# Patient Record
Sex: Female | Born: 1985 | Race: Black or African American | Hispanic: No | Marital: Married | Smoking: Never smoker
Health system: Southern US, Community
[De-identification: ages and names within clinical notes are randomized; demographics above are authoritative.]

## PROBLEM LIST (undated history)

## (undated) DIAGNOSIS — L409 Psoriasis, unspecified: Secondary | ICD-10-CM

## (undated) DIAGNOSIS — F419 Anxiety disorder, unspecified: Secondary | ICD-10-CM

## (undated) DIAGNOSIS — E669 Obesity, unspecified: Secondary | ICD-10-CM

## (undated) DIAGNOSIS — M1990 Unspecified osteoarthritis, unspecified site: Secondary | ICD-10-CM

## (undated) DIAGNOSIS — E282 Polycystic ovarian syndrome: Secondary | ICD-10-CM

## (undated) HISTORY — DX: Obesity, unspecified: E66.9

## (undated) HISTORY — DX: Psoriasis, unspecified: L40.9

## (undated) HISTORY — PX: BREAST REDUCTION SURGERY: SHX8

---
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
## 2005-02-10 ENCOUNTER — Emergency Department (HOSPITAL_COMMUNITY): Admission: EM | Admit: 2005-02-10 | Discharge: 2005-02-10 | Payer: Self-pay | Admitting: Emergency Medicine

## 2005-12-14 ENCOUNTER — Emergency Department (HOSPITAL_COMMUNITY): Admission: EM | Admit: 2005-12-14 | Discharge: 2005-12-15 | Payer: Self-pay | Admitting: Emergency Medicine

## 2006-12-15 ENCOUNTER — Emergency Department (HOSPITAL_COMMUNITY): Admission: EM | Admit: 2006-12-15 | Discharge: 2006-12-15 | Payer: Self-pay | Admitting: Emergency Medicine

## 2008-01-09 ENCOUNTER — Emergency Department (HOSPITAL_COMMUNITY): Admission: EM | Admit: 2008-01-09 | Discharge: 2008-01-09 | Payer: Self-pay | Admitting: Emergency Medicine

## 2008-05-05 ENCOUNTER — Emergency Department (HOSPITAL_COMMUNITY): Admission: EM | Admit: 2008-05-05 | Discharge: 2008-05-05 | Payer: Self-pay | Admitting: Emergency Medicine

## 2008-05-14 ENCOUNTER — Emergency Department (HOSPITAL_COMMUNITY): Admission: EM | Admit: 2008-05-14 | Discharge: 2008-05-14 | Payer: Self-pay | Admitting: Emergency Medicine

## 2008-05-17 ENCOUNTER — Emergency Department (HOSPITAL_COMMUNITY): Admission: EM | Admit: 2008-05-17 | Discharge: 2008-05-17 | Payer: Self-pay | Admitting: Emergency Medicine

## 2010-09-13 ENCOUNTER — Ambulatory Visit (HOSPITAL_COMMUNITY): Payer: Self-pay

## 2011-01-07 ENCOUNTER — Emergency Department (HOSPITAL_COMMUNITY)
Admission: EM | Admit: 2011-01-07 | Discharge: 2011-01-07 | Disposition: A | Discharge status: Home / Self Care | Payer: Self-pay | Attending: Emergency Medicine | Admitting: Emergency Medicine

## 2011-01-07 ENCOUNTER — Emergency Department (HOSPITAL_COMMUNITY): Payer: Self-pay

## 2011-01-07 DIAGNOSIS — R0602 Shortness of breath: Secondary | ICD-10-CM | POA: Insufficient documentation

## 2011-01-07 DIAGNOSIS — R059 Cough, unspecified: Secondary | ICD-10-CM | POA: Insufficient documentation

## 2011-01-07 DIAGNOSIS — R509 Fever, unspecified: Secondary | ICD-10-CM | POA: Insufficient documentation

## 2011-01-07 DIAGNOSIS — J45909 Unspecified asthma, uncomplicated: Secondary | ICD-10-CM | POA: Insufficient documentation

## 2011-01-07 DIAGNOSIS — R05 Cough: Secondary | ICD-10-CM | POA: Insufficient documentation

## 2011-01-07 LAB — CBC
HCT: 39.7 % (ref 36.0–46.0)
MCH: 30.1 pg (ref 26.0–34.0)
MCHC: 33.5 g/dL (ref 30.0–36.0)
Platelets: 249 10*3/uL (ref 150–400)
RBC: 4.42 MIL/uL (ref 3.87–5.11)
RDW: 12.4 % (ref 11.5–15.5)
WBC: 11.1 10*3/uL — ABNORMAL HIGH (ref 4.0–10.5)

## 2011-01-07 LAB — POCT I-STAT, CHEM 8
Calcium, Ion: 1.12 mmol/L (ref 1.12–1.32)
Creatinine, Ser: 0.8 mg/dL (ref 0.50–1.10)
Glucose, Bld: 109 mg/dL — ABNORMAL HIGH (ref 70–99)
HCT: 43 % (ref 36.0–46.0)
Hemoglobin: 14.6 g/dL (ref 12.0–15.0)
Potassium: 3.6 mEq/L (ref 3.5–5.1)
TCO2: 21 mmol/L (ref 0–100)

## 2011-01-07 LAB — URINALYSIS, ROUTINE W REFLEX MICROSCOPIC
Glucose, UA: NEGATIVE mg/dL
Hgb urine dipstick: NEGATIVE
Ketones, ur: NEGATIVE mg/dL
Leukocytes, UA: NEGATIVE
Nitrite: NEGATIVE
Protein, ur: NEGATIVE mg/dL
Specific Gravity, Urine: 1.02 (ref 1.005–1.030)
Urobilinogen, UA: 1 mg/dL (ref 0.0–1.0)

## 2011-01-07 LAB — DIFFERENTIAL
Basophils Absolute: 0 10*3/uL (ref 0.0–0.1)
Basophils Relative: 0 % (ref 0–1)
Eosinophils Absolute: 0.6 10*3/uL (ref 0.0–0.7)
Eosinophils Relative: 5 % (ref 0–5)
Lymphocytes Relative: 17 % (ref 12–46)
Monocytes Absolute: 0.7 10*3/uL (ref 0.1–1.0)
Neutrophils Relative %: 71 % (ref 43–77)

## 2011-01-07 LAB — POCT PREGNANCY, URINE: Preg Test, Ur: NEGATIVE

## 2011-02-11 LAB — RAPID STREP SCREEN (MED CTR MEBANE ONLY): Streptococcus, Group A Screen (Direct): POSITIVE — AB

## 2011-02-21 LAB — I-STAT 8, (EC8 V) (CONVERTED LAB)
BUN: 4 — ABNORMAL LOW
Bicarbonate: 25.6 — ABNORMAL HIGH
Chloride: 107
Glucose, Bld: 88
HCT: 43
Hemoglobin: 14.6
Operator id: 234501
Potassium: 4
Sodium: 140
TCO2: 27
pCO2, Ven: 43.3 — ABNORMAL LOW
pH, Ven: 7.38 — ABNORMAL HIGH

## 2011-02-21 LAB — POCT CARDIAC MARKERS
CKMB, poc: <1 — ABNORMAL LOW
Myoglobin, poc: 28
Operator id: 294501
Troponin i, poc: <0.05

## 2011-02-21 LAB — PROTIME-INR
INR: 1
Prothrombin Time: 13.2

## 2011-02-21 LAB — D-DIMER, QUANTITATIVE: D-Dimer, Quant: 0.34

## 2011-02-21 LAB — POCT I-STAT CREATININE
Creatinine, Ser: 0.7
Operator id: 234501

## 2011-09-06 ENCOUNTER — Encounter: Payer: Self-pay | Admitting: *Deleted

## 2011-09-06 ENCOUNTER — Other Ambulatory Visit: Payer: Self-pay | Admitting: Obstetrics and Gynecology

## 2011-09-06 ENCOUNTER — Ambulatory Visit (INDEPENDENT_AMBULATORY_CARE_PROVIDER_SITE_OTHER): Payer: Self-pay | Admitting: *Deleted

## 2011-09-06 VITALS — BP 127/87 | HR 94 | Temp 98.5°F | Ht 66.0 in | Wt 219.8 lb

## 2011-09-06 DIAGNOSIS — N63 Unspecified lump in unspecified breast: Secondary | ICD-10-CM

## 2011-09-06 DIAGNOSIS — Z1239 Encounter for other screening for malignant neoplasm of breast: Secondary | ICD-10-CM

## 2011-09-06 DIAGNOSIS — N6320 Unspecified lump in the left breast, unspecified quadrant: Secondary | ICD-10-CM

## 2011-09-06 NOTE — Patient Instructions (Signed)
Taught patient how to perform BSE and gave educational materials to take home. Patient did not need a Pap smear today due to last Pap smear was August 2012 per patient. Let her know BCCCP will cover Pap smears every 3 years unless has a history of abnormal Pap smears. Patient referred to the [facility] for left breast ultrasound. Appointment scheduled for Sep 15, 2011 at 1400. Patient is scheduled to follow up at the [facility] for amenorrhea. Appointment scheduled for Wednesday, Sep 07, 2011 at 1500. Patient aware of appointments and will be there. Let patient know will follow up with her within the next couple weeks with results. Patient verbalized understanding.

## 2011-09-06 NOTE — Progress Notes (Signed)
Complaints of lump in left breast that is tender to touch. Complaints of itching and moist irritated skin between breasts.  Pap Smear:    Pap smear not performed today. Per patient last Pap smear was August 2012 at [facility] and normal. Per patient she has no history of abnormal Pap smears. No Pap smear results in EPIC. Have requested Pap smear result from [facility].  Physical exam: Breasts Breasts symmetrical. Skin between breasts yeast like appearance and per patient itches. Suggested to try a small amount of OTC lotrimin cream between breast daily and if doesn't improve to notify her physician. No nipple retraction bilateral breasts. No nipple discharge bilateral breasts. No lymphadenopathy. No lumps palpated right breast. Palpated lump in the left breast at 8 o'clock 6 cm from the areola. Left breast lump tender to palpation. Patient referred to the [facility] for left breast ultrasound. Appointment scheduled for Sep 15, 2011 at 1400.         Pelvic/Bimanual No Pap smear completed today since last Pap smear was August 2012. Pap smear not indicated per BCCCP guidelines. Patient stated she has not had a menstrual cycle in 10 months. Patient is not on any type of birth control and stated had a negative UPT 2 weeks ago. Referred patient to the [facility] for follow up. Appointment scheduled for Wednesday, Sep 07, 2011 at 1500.

## 2011-09-07 ENCOUNTER — Encounter: Payer: Self-pay | Admitting: Physician Assistant

## 2011-09-07 ENCOUNTER — Ambulatory Visit (INDEPENDENT_AMBULATORY_CARE_PROVIDER_SITE_OTHER): Payer: Self-pay | Admitting: Physician Assistant

## 2011-09-07 VITALS — BP 131/97 | HR 83 | Temp 99.2°F | Ht 66.0 in | Wt 220.3 lb

## 2011-09-07 DIAGNOSIS — N911 Secondary amenorrhea: Secondary | ICD-10-CM

## 2011-09-07 DIAGNOSIS — L680 Hirsutism: Secondary | ICD-10-CM

## 2011-09-07 DIAGNOSIS — E669 Obesity, unspecified: Secondary | ICD-10-CM

## 2011-09-07 DIAGNOSIS — N912 Amenorrhea, unspecified: Secondary | ICD-10-CM

## 2011-09-07 DIAGNOSIS — L83 Acanthosis nigricans: Secondary | ICD-10-CM

## 2011-09-07 LAB — POCT PREGNANCY, URINE: Preg Test, Ur: NEGATIVE

## 2011-09-07 LAB — FOLLICLE STIMULATING HORMONE: FSH: 4.8 m[IU]/mL

## 2011-09-07 LAB — PROLACTIN: Prolactin: 7 ng/mL

## 2011-09-07 MED ORDER — MEDROXYPROGESTERONE ACETATE 10 MG PO TABS: 10.0000 mg | ORAL_TABLET | Freq: Every day | ORAL | Status: DC

## 2011-09-07 NOTE — Patient Instructions (Signed)
1800 Calorie Diabetic Diet The 1800 calorie diabetic diet is designed for eating up to 1800 calories each day. Following this diet and making healthy meal choices can help improve overall health. It controls blood glucose (sugar) levels, and it can also help lower blood pressure and cholesterol. SERVING SIZES Measuring foods and serving sizes helps to make sure you are getting the right amount of food. The list below tells how big or small some common serving sizes are:  1 oz.........4 stacked dice.   3 oz.........Deck of cards.   1 tsp........Tip of little finger.   1 tbs........Thumb.   2 tbs........Golf ball.    cup.......Half of a fist.   1 cup........A fist.  GUIDELINES FOR CHOOSING FOODS The goal of this diet is to eat a variety of foods and limit calories to 1800 each day. This can be done by choosing foods that are low in calories and fat. The diet also suggests eating small amounts of food frequently. Doing this helps control your blood glucose levels so they do not get too high or too low. Each meal or snack may include a protein food source to help you feel more satisfied. Try to eat about the same amount of food around the same time each day. This includes weekend days, travel days, and days off work. Space your meals about 4 to 5 hours apart, and add a snack between them, if you wish.  For example, a daily food plan could include breakfast, a morning snack, lunch, dinner, and an evening snack. Healthy meals and snacks have different types of foods, including whole grains, vegetables, fruits, lean meats, poultry, fish, and dairy products. As you plan your meals, select a variety of foods. Choose from the bread and starch, vegetable, fruit, dairy, and meat/protein groups. Examples of foods from each group are listed below with their suggested serving sizes. Use measuring cups and spoons to become familiar with what a healthy portion looks like. Bread and Starch Each serving equals  15 grams of carbohydrates.  1 slice bread.    bagel.    cup cold cereal (unsweetened).    cup hot cereal or mashed potatoes.   1 small potato (size of a computer mouse).   ? cup cooked pasta or rice.    English muffin.   1 cup broth-based soup.   3 cups of popcorn.   4 to 6 whole-wheat crackers.    cup cooked beans, peas, or corn.  Vegetables Each serving equals 5 grams of carbohydrates.   cup cooked vegetables.   1 cup raw vegetables.    cup tomato or vegetable juice.  Fruit Each serving equals 15 grams of carbohydrates.  1 small apple or orange.   1  cup watermelon or strawberries.    cup applesauce (no sugar added).   2 tbs raisins.    banana.    cup canned fruit, packed in water or in its own juice.    cup unsweetened fruit juice.  Dairy Each serving equals 12 to 15 grams of carbohydrates.  1 cup fat-free milk.   6 oz artificially sweetened yogurt or plain yogurt.   1 cup low-fat buttermilk.   1 cup soy milk.   1 cup almond milk.  Meat/Protein  1 large egg.   2 to 3 oz meat, poultry, or fish.    cup low-fat cottage cheese.   1 tbs peanut butter.   1 oz low-fat cheese.    cup tuna, packed in water.      cup tofu.  Fat  1 tsp oil.   1 tsp trans-fat-free margarine.   1 tsp butter.   1 tsp mayonnaise.   2 tbs avocado.   1 tbs salad dressing.   1 tbs cream cheese.   2 tbs sour cream.  SAMPLE 1800 CALORIE DIET PLAN Breakfast   cup unsweetened cereal (1 carb serving).   1 cup fat-free milk (1 carb serving).   1 slice whole-wheat toast (1 carb serving).    small banana (1 carb serving).   1 scrambled egg.   1 tsp trans-fat-free margarine.  Lunch  Tuna sandwich.   2 slices whole-wheat bread (2 carb servings).    cup canned tuna in water, drained.   1 tbs reduced fat mayonnaise.   1 stalk celery, chopped.   2 slices tomato.   1 lettuce leaf.   1 cup carrot sticks.   24 to 30 seedless  grapes (2 carb servings).   6 oz light yogurt (1 carb serving).  Afternoon Snack  3 graham cracker squares (1 carb serving).   1 cup fat-free milk (1 carb serving).   1 tbs peanut butter.  Dinner  3 oz salmon, broiled with 1 tsp oil.   1 cup mashed potatoes (2 carb servings) with 1 tsp trans-fat-free margarine.   1 cup fresh or frozen green beans.   1 cup steamed asparagus.   1 cup fat-free milk (1 carb serving).  Evening Snack  3 cups of air-popped popcorn (1 carb serving).   2 tbs Parmesan cheese.  Meal Plan You can use this worksheet to help you make a daily meal plan based on the 1800 calorie diabetic diet suggestions. If you are using this plan to help you control your blood glucose, you may interchange carbohydrate-containing foods (dairy, starches, and fruits). Select a variety of fresh foods of varying colors and flavors. The total amount of carbohydrate in your meals or snacks is more important than making sure you include all of the food groups every time you eat. Choose from the approximate amount of the following foods to build your day's meals:  8 Starches.   4 Vegetables.   3 Fruits.   2 Dairy.   6 to 7 oz Meat/Protein.   Up to 4 Fats.  Your dietician can use this worksheet to help you decide how many servings and which types of foods are right for you. BREAKFAST Food Group and Servings / Food Choice Starches _______________________________________________________ Dairy __________________________________________________________ Fruit ___________________________________________________________ Meat/Protein ____________________________________________________ Fat ____________________________________________________________ LUNCH Food Group and Servings / Food Choice Starch _________________________________________________________ Meat/Protein ___________________________________________________ Vegetables  _____________________________________________________ Fruit __________________________________________________________ Dairy __________________________________________________________ Fat ____________________________________________________________ Aura Fey Food Group and Servings / Food Choice Starch ________________________________________________________ Meat/Protein ___________________________________________________ Fruit __________________________________________________________ Dairy __________________________________________________________ Laural Golden Food Group and Servings / Food Choice Starches _______________________________________________________ Meat/Protein ___________________________________________________ Dairy __________________________________________________________ Vegetable ______________________________________________________ Fruit ___________________________________________________________ Fat ____________________________________________________________ Lollie Sails Food Group and Servings / Food Choice Fruit __________________________________________________________ Meat/Protein ___________________________________________________ Dairy __________________________________________________________ Starch _________________________________________________________ DAILY TOTALS Starches _________________________ Vegetables _______________________ Fruits ____________________________ Dairy ____________________________ Meat/Protein_____________________ Fats _____________________________ Document Released: 11/15/2004 Document Revised: 04/14/2011 Document Reviewed: 03/11/2011 [employer] Patient Information 2012 [place], [place].Polycystic Ovarian Syndrome Polycystic ovarian syndrome is a condition with a number of problems. One problem is with the ovaries. The ovaries are organs located in the female pelvis, on each side of the uterus. Usually, during the menstrual  cycle, an egg is released from 1 ovary every month. This is called ovulation. When the egg is fertilized, it goes into the womb (uterus), which allows  for the growth of a baby. The egg travels from the ovary through the fallopian tube to the uterus. The ovaries also make the hormones estrogen and progesterone. These hormones help the development of a woman's breasts, body shape, and body hair. They also regulate the menstrual cycle and pregnancy. Sometimes, cysts form in the ovaries. A cyst is a fluid-filled sac. On the ovary, different types of cysts can form. The most common type of ovarian cyst is called a functional or ovulation cyst. It is normal, and often forms during the normal menstrual cycle. Each month, a woman's ovaries grow tiny cysts that hold the eggs. When an egg is fully grown, the sac breaks open. This releases the egg. Then, the sac which released the egg from the ovary dissolves. In one type of functional cyst, called a follicle cyst, the sac does not break open to release the egg. It may actually continue to grow. This type of cyst usually disappears within 1 to 3 months.  One type of cyst problem with the ovaries is called Polycystic Ovarian Syndrome (PCOS). In this condition, many follicle cysts form, but do not rupture and produce an egg. This health problem can affect the following:  Menstrual cycle.   Heart.   Obesity.   Cancer of the uterus.   Fertility.   Blood vessels.   Hair growth (face and body) or baldness.   Hormones.   Appearance.   High blood pressure.   Stroke.   Insulin production.   Inflammation of the liver.   Elevated blood cholesterol and triglycerides.  CAUSES   No one knows the exact cause of PCOS.   Women with PCOS often have a mother or sister with PCOS. There is not yet enough proof to say this is inherited.   Many women with PCOS have a weight problem.   Researchers are looking at the relationship between PCOS and the body's  ability to make insulin. Insulin is a hormone that regulates the change of sugar, starches, and other food into energy for the body's use, or for storage. Some women with PCOS make too much insulin. It is possible that the ovaries react by making too many female hormones, called androgens. This can lead to acne, excessive hair growth, weight gain, and ovulation problems.   Too much production of luteinizing hormone (LH) from the pituitary gland in the brain stimulates the ovary to produce too much female hormone (androgen).  SYMPTOMS   Infrequent or no menstrual periods, and/or irregular bleeding.   Inability to get pregnant (infertility), because of not ovulating.   Increased growth of hair on the face, chest, stomach, back, thumbs, thighs, or toes.   Acne, oily skin, or dandruff.   Pelvic pain.   Weight gain or obesity, usually carrying extra weight around the waist.   Type 2 diabetes (this is the diabetes that usually does not need insulin).   High cholesterol.   High blood pressure.   Female-pattern baldness or thinning hair.   Patches of thickened and dark brown or black skin on the neck, arms, breasts, or thighs.   Skin tags, or tiny excess flaps of skin, in the armpits or neck area.   Sleep apnea (excessive snoring and breathing stops at times while asleep).   Deepening of the voice.   Gestational diabetes when pregnant.   Increased risk of miscarriage with pregnancy.  DIAGNOSIS  There is no single test to diagnose PCOS.   Your caregiver will:   Take a medical  history.   Perform a pelvic exam.   Perform an ultrasound.   Check your female and female hormone levels.   Measure glucose or sugar levels in the blood.   Do other blood tests.   If you are producing too many female hormones, your caregiver will make sure it is from PCOS. At the physical exam, your caregiver will want to evaluate the areas of increased hair growth. Try to allow natural hair growth for a few  days before the visit.   During a pelvic exam, the ovaries may be enlarged or swollen by the increased number of small cysts. This can be seen more easily by vaginal ultrasound or screening, to examine the ovaries and lining of the uterus (endometrium) for cysts. The uterine lining may become thicker, if there has not been a regular period.  TREATMENT  Because there is no cure for PCOS, it needs to be managed to prevent problems. Treatments are based on your symptoms. Treatment is also based on whether you want to have a baby or whether you need contraception.  Treatment may include:  Progesterone hormone, to start a menstrual period.   Birth control pills, to make you have regular menstrual periods.   Medicines to make you ovulate, if you want to get pregnant.   Medicines to control your insulin.   Medicine to control your blood pressure.   Medicine and diet, to control your high cholesterol and triglycerides in your blood.   Surgery, making small holes in the ovary, to decrease the amount of female hormone production. This is done through a long, lighted tube (laparoscope), placed into the pelvis through a tiny incision in the lower abdomen.  Your caregiver will go over some of the choices with you. WOMEN WITH PCOS HAVE THESE CHARACTERISTICS:  High levels of female hormones called androgens.   An irregular or no menstrual cycle.   May have many small cysts in their ovaries.  PCOS is the most common hormonal reproductive problem in women of childbearing age. WHY DO WOMEN WITH PCOS HAVE TROUBLE WITH THEIR MENSTRUAL CYCLE? Each month, about 20 eggs start to mature in the ovaries. As one egg grows and matures, the follicle breaks open to release the egg, so it can travel through the fallopian tube for fertilization. When the single egg leaves the follicle, ovulation takes place. In women with PCOS, the ovary does not make all of the hormones it needs for any of the eggs to fully mature. They  may start to grow and accumulate fluid, but no one egg becomes large enough. Instead, some may remain as cysts. Since no egg matures or is released, ovulation does not occur and the hormone progesterone is not made. Without progesterone, a woman's menstrual cycle is irregular or absent. Also, the cysts produce female hormones, which continue to prevent ovulation.  Document Released: 08/19/2004 Document Revised: 04/14/2011 Document Reviewed: 03/13/2009 [facility] Patient Information 2012 [place], [place].Secondary Amenorrhea  Secondary amenorrhea is the stopping of menstrual flow for 3 to 6 months in a female who has previously had periods. There are many possible causes. Most of these causes are not serious. Usually treating the underlying problem causing the loss of menses will return your periods to normal. CAUSES  Some common and uncommon causes of not menstruating include:  Malnutrition.   Low blood sugar (hypoglycemia).   Polycystic ovarian disease.   Stress or fear.   Breastfeeding.   Hormone imbalance.   Ovarian failure.   Medications.   Extreme  obesity.   Cystic fibrosis.   Low body weight or drastic weight reduction from any cause.   Early menopause.   Removal of ovaries or uterus.   Contraceptives.   Illness.   Long term (chronic) illnesses.   Cushing's syndrome.   Thyroid problems.   Birth control pills, patches, or vaginal rings for birth control.  DIAGNOSIS  This diagnosis is made by your caregiver taking a medical history and doing a physical exam. Pregnancy must be ruled out. Often times, numerous blood tests of different hormones in the body may be measured. Urine testing may be done. Specialized x-rays may have to be done as well as measuring the body mass index (BMI). TREATMENT  Treatment depends on the cause of the amenorrhea. If an eating disorder is present, this can be treated with an adequate diet and therapy. Chronic illnesses may improve with  treatment of the illness. Overall, the outlook is good. The amenorrhea may be corrected with medications, lifestyle changes, or surgery. If the amenorrhea cannot be corrected, it is sometimes possible to create a false menstruation with medications. Document Released: 06/06/2006 Document Revised: 04/14/2011 Document Reviewed: 04/13/2007 [facility] Patient Information 2012 [place], [place].

## 2011-09-07 NOTE — Progress Notes (Signed)
Chief Complaint:  Amenorrhea   Tanya Hayes is  26 y.o. [id number].  Patient's last menstrual period was 11/29/2010.Marland Kitchen  Her pregnancy status is negative.  She presents complaining of Amenorrhea . Onset is described as ongoing and has been present for  10 months. Reports seeing Dr. Billy Coast in 2011 and was given a medication for 5 days that caused her to have a period, then q 2-4 months after.  Obstetrical/Gynecological History: OB History    Grav Para Term Preterm Abortions TAB SAB Ect Mult Living   2 2 2       2       Past Medical History: Past Medical History  Diagnosis Date  . Asthma     Past Surgical History: No past surgical history on file.  Family History: Family History  Problem Relation Age of Onset  . Hypertension Maternal Grandmother   . Thyroid disease Maternal Grandmother   . Diabetes Father     Social History: History  Substance Use Topics  . Smoking status: Never Smoker   . Smokeless tobacco: Never Used  . Alcohol Use: Yes     occassionally    Allergies: No Known Allergies  Review of Systems - Negative except what has been reviewed in HPI  Physical Exam   Blood pressure 131/97, pulse 83, temperature 99.2 F (37.3 C), height 5\' 6"  (1.676 m), weight 220 lb 4.8 oz (99.927 kg), last menstrual period 11/29/2010.  General: General appearance - alert, well appearing, and in no distress, oriented to person, place, and time and overweight Mental status - alert, oriented to person, place, and time, normal mood, behavior, speech, dress, motor activity, and thought processes, affect appropriate to mood Focused Gynecological Exam: UTERUS: non tender, ADNEXA: non tender    Assessment: 1. Secondary amenorrhea  TSH, Testosterone, free, total, Insulin, random, FSH, Prolactin, US Pelvis Complete, US Transvaginal Non-OB  2. Acanthosis nigricans  TSH, Testosterone, free, total, Insulin, random, FSH, Prolactin, US Pelvis Complete, US Transvaginal Non-OB  3. Hirsutism   TSH, Testosterone, free, total, Insulin, random, FSH, Prolactin, US Pelvis Complete, US Transvaginal Non-OB  4. Obesity      Plan: Provera Challenge Weight loss encouraged 1800 Diabetic Diet RTC 2-3 weeks for results  Waleed Dettman E. 09/07/2011,3:52 PM

## 2011-09-08 LAB — INSULIN, RANDOM: Insulin: 65 u[IU]/mL — ABNORMAL HIGH (ref 3–28)

## 2011-09-09 LAB — TESTOSTERONE, FREE, TOTAL, SHBG
Sex Hormone Binding: 20 nmol/L (ref 18–114)
Testosterone, Free: 21 pg/mL — ABNORMAL HIGH (ref 0.6–6.8)
Testosterone-% Free: 2.4 % (ref 0.4–2.4)

## 2011-09-15 ENCOUNTER — Ambulatory Visit
Admission: RE | Admit: 2011-09-15 | Discharge: 2011-09-15 | Disposition: A | Discharge status: Home / Self Care | Payer: No Typology Code available for payment source | Source: Ambulatory Visit | Attending: Obstetrics and Gynecology | Admitting: Obstetrics and Gynecology

## 2011-09-15 DIAGNOSIS — N63 Unspecified lump in unspecified breast: Secondary | ICD-10-CM

## 2011-09-20 ENCOUNTER — Telehealth: Payer: Self-pay

## 2011-09-20 NOTE — Telephone Encounter (Signed)
Called pt and left message to return our call to Christine Brannock @ [phone].  

## 2011-09-21 ENCOUNTER — Telehealth: Payer: Self-pay | Admitting: *Deleted

## 2011-09-21 NOTE — Telephone Encounter (Signed)
Patient called me back this morning. Verified that patient has received results from the [facility] in regards to her diagnostic mammogram and breast ultrasound. Patient aware. Also, let patient know that will need a left breast ultrasound in 6 months and that she can call and schedule. Let her know BCCCP will cover and to show her pink card at appointment. Patient verbalized understanding.

## 2011-09-28 ENCOUNTER — Ambulatory Visit (HOSPITAL_COMMUNITY)
Admission: RE | Admit: 2011-09-28 | Discharge: 2011-09-28 | Disposition: A | Discharge status: Home / Self Care | Source: Ambulatory Visit | Attending: Physician Assistant | Admitting: Physician Assistant

## 2011-09-28 DIAGNOSIS — L83 Acanthosis nigricans: Secondary | ICD-10-CM | POA: Insufficient documentation

## 2011-09-28 DIAGNOSIS — N838 Other noninflammatory disorders of ovary, fallopian tube and broad ligament: Secondary | ICD-10-CM | POA: Insufficient documentation

## 2011-09-28 DIAGNOSIS — L680 Hirsutism: Secondary | ICD-10-CM | POA: Insufficient documentation

## 2011-09-28 DIAGNOSIS — N911 Secondary amenorrhea: Secondary | ICD-10-CM

## 2011-09-28 DIAGNOSIS — N912 Amenorrhea, unspecified: Secondary | ICD-10-CM | POA: Insufficient documentation

## 2011-10-12 ENCOUNTER — Ambulatory Visit (INDEPENDENT_AMBULATORY_CARE_PROVIDER_SITE_OTHER): Payer: Medicaid Other | Admitting: Physician Assistant

## 2011-10-12 VITALS — BP 126/88 | HR 84 | Temp 98.5°F | Ht 66.0 in | Wt 220.8 lb

## 2011-10-12 DIAGNOSIS — E282 Polycystic ovarian syndrome: Secondary | ICD-10-CM | POA: Insufficient documentation

## 2011-10-12 MED ORDER — METFORMIN HCL 1000 MG PO TABS: 1000.0000 mg | ORAL_TABLET | Freq: Two times a day (BID) | ORAL | Status: DC

## 2011-10-12 MED ORDER — MEDROXYPROGESTERONE ACETATE 10 MG PO TABS: 10.0000 mg | ORAL_TABLET | Freq: Every day | ORAL | Status: DC

## 2011-10-12 NOTE — Patient Instructions (Signed)
1800 Calorie Diabetic Diet The 1800 calorie diabetic diet is designed for eating up to 1800 calories each day. Following this diet and making healthy meal choices can help improve overall health. It controls blood glucose (sugar) levels, and it can also help lower blood pressure and cholesterol. SERVING SIZES Measuring foods and serving sizes helps to make sure you are getting the right amount of food. The list below tells how big or small some common serving sizes are:  1 oz.........4 stacked dice.   3 oz.........Deck of cards.   1 tsp........Tip of little finger.   1 tbs........Thumb.   2 tbs........Golf ball.    cup.......Half of a fist.   1 cup........A fist.  GUIDELINES FOR CHOOSING FOODS The goal of this diet is to eat a variety of foods and limit calories to 1800 each day. This can be done by choosing foods that are low in calories and fat. The diet also suggests eating small amounts of food frequently. Doing this helps control your blood glucose levels so they do not get too high or too low. Each meal or snack may include a protein food source to help you feel more satisfied. Try to eat about the same amount of food around the same time each day. This includes weekend days, travel days, and days off work. Space your meals about 4 to 5 hours apart, and add a snack between them, if you wish.  For example, a daily food plan could include breakfast, a morning snack, lunch, dinner, and an evening snack. Healthy meals and snacks have different types of foods, including whole grains, vegetables, fruits, lean meats, poultry, fish, and dairy products. As you plan your meals, select a variety of foods. Choose from the bread and starch, vegetable, fruit, dairy, and meat/protein groups. Examples of foods from each group are listed below with their suggested serving sizes. Use measuring cups and spoons to become familiar with what a healthy portion looks like. Bread and Starch Each serving equals  15 grams of carbohydrates.  1 slice bread.    bagel.    cup cold cereal (unsweetened).    cup hot cereal or mashed potatoes.   1 small potato (size of a computer mouse).   ? cup cooked pasta or rice.    English muffin.   1 cup broth-based soup.   3 cups of popcorn.   4 to 6 whole-wheat crackers.    cup cooked beans, peas, or corn.  Vegetables Each serving equals 5 grams of carbohydrates.   cup cooked vegetables.   1 cup raw vegetables.    cup tomato or vegetable juice.  Fruit Each serving equals 15 grams of carbohydrates.  1 small apple or orange.   1  cup watermelon or strawberries.    cup applesauce (no sugar added).   2 tbs raisins.    banana.    cup canned fruit, packed in water or in its own juice.    cup unsweetened fruit juice.  Dairy Each serving equals 12 to 15 grams of carbohydrates.  1 cup fat-free milk.   6 oz artificially sweetened yogurt or plain yogurt.   1 cup low-fat buttermilk.   1 cup soy milk.   1 cup almond milk.  Meat/Protein  1 large egg.   2 to 3 oz meat, poultry, or fish.    cup low-fat cottage cheese.   1 tbs peanut butter.   1 oz low-fat cheese.    cup tuna, packed in water.      cup tofu.  Fat  1 tsp oil.   1 tsp trans-fat-free margarine.   1 tsp butter.   1 tsp mayonnaise.   2 tbs avocado.   1 tbs salad dressing.   1 tbs cream cheese.   2 tbs sour cream.  SAMPLE 1800 CALORIE DIET PLAN Breakfast   cup unsweetened cereal (1 carb serving).   1 cup fat-free milk (1 carb serving).   1 slice whole-wheat toast (1 carb serving).    small banana (1 carb serving).   1 scrambled egg.   1 tsp trans-fat-free margarine.  Lunch  Tuna sandwich.   2 slices whole-wheat bread (2 carb servings).    cup canned tuna in water, drained.   1 tbs reduced fat mayonnaise.   1 stalk celery, chopped.   2 slices tomato.   1 lettuce leaf.   1 cup carrot sticks.   24 to 30 seedless  grapes (2 carb servings).   6 oz light yogurt (1 carb serving).  Afternoon Snack  3 graham cracker squares (1 carb serving).   1 cup fat-free milk (1 carb serving).   1 tbs peanut butter.  Dinner  3 oz salmon, broiled with 1 tsp oil.   1 cup mashed potatoes (2 carb servings) with 1 tsp trans-fat-free margarine.   1 cup fresh or frozen green beans.   1 cup steamed asparagus.   1 cup fat-free milk (1 carb serving).  Evening Snack  3 cups of air-popped popcorn (1 carb serving).   2 tbs Parmesan cheese.  Meal Plan You can use this worksheet to help you make a daily meal plan based on the 1800 calorie diabetic diet suggestions. If you are using this plan to help you control your blood glucose, you may interchange carbohydrate-containing foods (dairy, starches, and fruits). Select a variety of fresh foods of varying colors and flavors. The total amount of carbohydrate in your meals or snacks is more important than making sure you include all of the food groups every time you eat. Choose from the approximate amount of the following foods to build your day's meals:  8 Starches.   4 Vegetables.   3 Fruits.   2 Dairy.   6 to 7 oz Meat/Protein.   Up to 4 Fats.  Your dietician can use this worksheet to help you decide how many servings and which types of foods are right for you. BREAKFAST Food Group and Servings / Food Choice Starches _______________________________________________________ Dairy __________________________________________________________ Fruit ___________________________________________________________ Meat/Protein ____________________________________________________ Fat ____________________________________________________________ LUNCH Food Group and Servings / Food Choice Starch _________________________________________________________ Meat/Protein ___________________________________________________ Vegetables  _____________________________________________________ Fruit __________________________________________________________ Dairy __________________________________________________________ Fat ____________________________________________________________ Tanya Hayes Food Group and Servings / Food Choice Starch ________________________________________________________ Meat/Protein ___________________________________________________ Fruit __________________________________________________________ Dairy __________________________________________________________ Tanya Hayes Food Group and Servings / Food Choice Starches _______________________________________________________ Meat/Protein ___________________________________________________ Dairy __________________________________________________________ Vegetable ______________________________________________________ Fruit ___________________________________________________________ Fat ____________________________________________________________ Tanya Hayes Food Group and Servings / Food Choice Fruit __________________________________________________________ Meat/Protein ___________________________________________________ Dairy __________________________________________________________ Starch _________________________________________________________ DAILY TOTALS Starches _________________________ Vegetables _______________________ Fruits ____________________________ Dairy ____________________________ Meat/Protein_____________________ Fats _____________________________ Document Released: 11/15/2004 Document Revised: 04/14/2011 Document Reviewed: 03/11/2011 [employer] Patient Information 2012 [place], [place].Diabetes and Exercise Regular exercise is important and can help:   Control blood glucose (sugar).   Decrease blood pressure.    Control blood lipids (cholesterol, triglycerides).   Improve overall health.  BENEFITS FROM  EXERCISE  Improved fitness.   Improved flexibility.   Improved endurance.   Increased bone density.   Weight control.   Increased muscle strength.   Decreased  body fat.   Improvement of the body's use of insulin, a hormone.   Increased insulin sensitivity.   Reduction of insulin needs.   Reduced stress and tension.   Helps you feel better.  People with diabetes who add exercise to their lifestyle gain additional benefits, including:  Weight loss.   Reduced appetite.   Improvement of the body's use of blood glucose.   Decreased risk factors for heart disease:   Lowering of cholesterol and triglycerides.   Raising the level of good cholesterol (high-density lipoproteins, HDL).   Lowering blood sugar.   Decreased blood pressure.  TYPE 1 DIABETES AND EXERCISE  Exercise will usually lower your blood glucose.   If blood glucose is greater than 240 mg/dl, check urine ketones. If ketones are present, do not exercise.   Location of the insulin injection sites may need to be adjusted with exercise. Avoid injecting insulin into areas of the body that will be exercised. For example, avoid injecting insulin into:   The arms when playing tennis.   The legs when jogging. For more information, discuss this with your caregiver.   Keep a record of:   Food intake.   Type and amount of exercise.   Expected peak times of insulin action.   Blood glucose levels.  Do this before, during, and after exercise. Review your records with your caregiver. This will help you to develop guidelines for adjusting food intake and insulin amounts.  TYPE 2 DIABETES AND EXERCISE  Regular physical activity can help control blood glucose.   Exercise is important because it may:   Increase the body's sensitivity to insulin.   Improve blood glucose control.   Exercise reduces the risk of heart disease. It decreases serum cholesterol and triglycerides. It also lowers blood pressure.    Those who take insulin or oral hypoglycemic agents should watch for signs of hypoglycemia. These signs include dizziness, shaking, sweating, chills, and confusion.   Body water is lost during exercise. It must be replaced. This will help to avoid loss of body fluids (dehydration) or heat stroke.  Be sure to talk to your caregiver before starting an exercise program to make sure it is safe for you. Remember, any activity is better than none.  Document Released: 07/16/2003 Document Revised: 04/14/2011 Document Reviewed: 10/30/2008 [facility] Patient Information 2012 [place], [place].Polycystic Ovarian Syndrome Polycystic ovarian syndrome is a condition with a number of problems. One problem is with the ovaries. The ovaries are organs located in the female pelvis, on each side of the uterus. Usually, during the menstrual cycle, an egg is released from 1 ovary every month. This is called ovulation. When the egg is fertilized, it goes into the womb (uterus), which allows for the growth of a baby. The egg travels from the ovary through the fallopian tube to the uterus. The ovaries also make the hormones estrogen and progesterone. These hormones help the development of a woman's breasts, body shape, and body hair. They also regulate the menstrual cycle and pregnancy. Sometimes, cysts form in the ovaries. A cyst is a fluid-filled sac. On the ovary, different types of cysts can form. The most common type of ovarian cyst is called a functional or ovulation cyst. It is normal, and often forms during the normal menstrual cycle. Each month, a woman's ovaries grow tiny cysts that hold the eggs. When an egg is fully grown, the sac breaks open. This releases the egg. Then, the sac which released the egg from the ovary dissolves. In  one type of functional cyst, called a follicle cyst, the sac does not break open to release the egg. It may actually continue to grow. This type of cyst usually disappears within 1 to 3 months.   One type of cyst problem with the ovaries is called Polycystic Ovarian Syndrome (PCOS). In this condition, many follicle cysts form, but do not rupture and produce an egg. This health problem can affect the following:  Menstrual cycle.   Heart.   Obesity.   Cancer of the uterus.   Fertility.   Blood vessels.   Hair growth (face and body) or baldness.   Hormones.   Appearance.   High blood pressure.   Stroke.   Insulin production.   Inflammation of the liver.   Elevated blood cholesterol and triglycerides.  CAUSES   No one knows the exact cause of PCOS.   Women with PCOS often have a mother or sister with PCOS. There is not yet enough proof to say this is inherited.   Many women with PCOS have a weight problem.   Researchers are looking at the relationship between PCOS and the body's ability to make insulin. Insulin is a hormone that regulates the change of sugar, starches, and other food into energy for the body's use, or for storage. Some women with PCOS make too much insulin. It is possible that the ovaries react by making too many female hormones, called androgens. This can lead to acne, excessive hair growth, weight gain, and ovulation problems.   Too much production of luteinizing hormone (LH) from the pituitary gland in the brain stimulates the ovary to produce too much female hormone (androgen).  SYMPTOMS   Infrequent or no menstrual periods, and/or irregular bleeding.   Inability to get pregnant (infertility), because of not ovulating.   Increased growth of hair on the face, chest, stomach, back, thumbs, thighs, or toes.   Acne, oily skin, or dandruff.   Pelvic pain.   Weight gain or obesity, usually carrying extra weight around the waist.   Type 2 diabetes (this is the diabetes that usually does not need insulin).   High cholesterol.   High blood pressure.   Female-pattern baldness or thinning hair.   Patches of thickened and dark brown or black skin  on the neck, arms, breasts, or thighs.   Skin tags, or tiny excess flaps of skin, in the armpits or neck area.   Sleep apnea (excessive snoring and breathing stops at times while asleep).   Deepening of the voice.   Gestational diabetes when pregnant.   Increased risk of miscarriage with pregnancy.  DIAGNOSIS  There is no single test to diagnose PCOS.   Your caregiver will:   Take a medical history.   Perform a pelvic exam.   Perform an ultrasound.   Check your female and female hormone levels.   Measure glucose or sugar levels in the blood.   Do other blood tests.   If you are producing too many female hormones, your caregiver will make sure it is from PCOS. At the physical exam, your caregiver will want to evaluate the areas of increased hair growth. Try to allow natural hair growth for a few days before the visit.   During a pelvic exam, the ovaries may be enlarged or swollen by the increased number of small cysts. This can be seen more easily by vaginal ultrasound or screening, to examine the ovaries and lining of the uterus (endometrium) for cysts. The uterine lining may become  thicker, if there has not been a regular period.  TREATMENT  Because there is no cure for PCOS, it needs to be managed to prevent problems. Treatments are based on your symptoms. Treatment is also based on whether you want to have a baby or whether you need contraception.  Treatment may include:  Progesterone hormone, to start a menstrual period.   Birth control pills, to make you have regular menstrual periods.   Medicines to make you ovulate, if you want to get pregnant.   Medicines to control your insulin.   Medicine to control your blood pressure.   Medicine and diet, to control your high cholesterol and triglycerides in your blood.   Surgery, making small holes in the ovary, to decrease the amount of female hormone production. This is done through a long, lighted tube (laparoscope), placed  into the pelvis through a tiny incision in the lower abdomen.  Your caregiver will go over some of the choices with you. WOMEN WITH PCOS HAVE THESE CHARACTERISTICS:  High levels of female hormones called androgens.   An irregular or no menstrual cycle.   May have many small cysts in their ovaries.  PCOS is the most common hormonal reproductive problem in women of childbearing age. WHY DO WOMEN WITH PCOS HAVE TROUBLE WITH THEIR MENSTRUAL CYCLE? Each month, about 20 eggs start to mature in the ovaries. As one egg grows and matures, the follicle breaks open to release the egg, so it can travel through the fallopian tube for fertilization. When the single egg leaves the follicle, ovulation takes place. In women with PCOS, the ovary does not make all of the hormones it needs for any of the eggs to fully mature. They may start to grow and accumulate fluid, but no one egg becomes large enough. Instead, some may remain as cysts. Since no egg matures or is released, ovulation does not occur and the hormone progesterone is not made. Without progesterone, a woman's menstrual cycle is irregular or absent. Also, the cysts produce female hormones, which continue to prevent ovulation.  Document Released: 08/19/2004 Document Revised: 04/14/2011 Document Reviewed: 03/13/2009 [facility] Patient Information 2012 [place], [place].

## 2011-10-12 NOTE — Progress Notes (Signed)
Chief Complaint:  Results   Tanya Hayes is  26 y.o. [id number].  Patient's last menstrual period was 09/19/2011..   She presents complaining of Results  Pt returns today for lab and [place] results. States + withdrawal bleeding 2 days after finishing provera challenge. Bleeding lasted for 7 days.  Obstetrical/Gynecological History: OB History    Grav Para Term Preterm Abortions TAB SAB Ect Mult Living   2 2 2       2       Past Medical History: Past Medical History  Diagnosis Date  . Asthma     Past Surgical History: No past surgical history on file.  Family History: Family History  Problem Relation Age of Onset  . Hypertension Maternal Grandmother   . Thyroid disease Maternal Grandmother   . Diabetes Father     Social History: History  Substance Use Topics  . Smoking status: Never Smoker   . Smokeless tobacco: Never Used  . Alcohol Use: Yes     occassionally    Allergies: No Known Allergies   (Not in a hospital admission)  Review of Systems - Negative except what has been reviewed in HPI  Physical Exam   Blood pressure 126/88, pulse 84, temperature 98.5 F (36.9 C), temperature source Oral, height 5\' 6"  (1.676 m), weight 220 lb 12.8 oz (100.154 kg), last menstrual period 09/19/2011.  General: General appearance - alert, well appearing, and in no distress, oriented to person, place, and time and overweight Mental status - alert, oriented to person, place, and time, normal mood, behavior, speech, dress, motor activity, and thought processes, affect appropriate to mood Focused Gynecological Exam: examination not indicated  Labs: Testosterone: 88.49 Free Test: 21 Random insulin: 65  Imaging Studies:  US Breast Left  09/15/2011  *RADIOLOGY REPORT*  Clinical Data:  Left breast mass for 2 months without interval change in size.  Intermittent tenderness.  LEFT BREAST ULTRASOUND  Comparison:  None.  On physical exam, I palpate a soft, mobile mass 8 o'clock position, 12  cm the nipple.  Findings: Ultrasound is performed, showing a slightly eight hypoechoic oval mass with circumscribed margins in the 8 o'clock position, 12 cm nipple measuring 1.7 x 1.4 x 0.8 cm.  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  Options were discussed the patient including sonographic follow up, percutaneous biopsy or excisional biopsy.  IMPRESSION: Probable fibroadenoma, left breast for which a breast ultrasound is recommended in 6 months.  BI-RADS CATEGORY 3:  Probably benign finding(s) - short interval follow-up suggested.  Original Report Authenticated By: Hiram Gash, M.D.   US Transvaginal Non-ob  09/28/2011  *RADIOLOGY REPORT*  Clinical Data: Amenorrhea  TRANSABDOMINAL AND TRANSVAGINAL ULTRASOUND OF PELVIS Technique:  Both transabdominal and transvaginal ultrasound examinations of the pelvis were performed. Transabdominal technique was performed for global imaging of the pelvis including uterus, ovaries, adnexal regions, and pelvic cul-de-sac.  Comparison: None.   It was necessary to proceed with endovaginal exam following the transabdominal exam to visualize the ovaries and endometrium.  Findings:  Uterus: Measures 8.1 x 4.1 x 5.0 cm.  No myometrial abnormalities.  Endometrium: Normal in thickness measuring a maximum of 3 mm.  Right ovary:  Measures 3.5 x 2.5 x 2.8 cm.  Multiple small peripheral follicles are noted.  Left ovary: Measures 3.9 x 2.6 x 2.2 cm.  Multiple small peripheral follicles are noted.  Other findings: No free fluid  IMPRESSION:  1.  Normal sonographic appearance of the uterus. 2.  Numerous small similar sized peripheral ovarian follicles.  Polycystic ovarian syndrome would be a consideration.  Original Report Authenticated By: P. Loralie Champagne, M.D.   US Pelvis Complete  09/28/2011  *RADIOLOGY REPORT*  Clinical Data: Amenorrhea  TRANSABDOMINAL AND TRANSVAGINAL ULTRASOUND OF PELVIS Technique:  Both transabdominal and transvaginal ultrasound examinations of the pelvis were  performed. Transabdominal technique was performed for global imaging of the pelvis including uterus, ovaries, adnexal regions, and pelvic cul-de-sac.  Comparison: None.   It was necessary to proceed with endovaginal exam following the transabdominal exam to visualize the ovaries and endometrium.  Findings:  Uterus: Measures 8.1 x 4.1 x 5.0 cm.  No myometrial abnormalities.  Endometrium: Normal in thickness measuring a maximum of 3 mm.  Right ovary:  Measures 3.5 x 2.5 x 2.8 cm.  Multiple small peripheral follicles are noted.  Left ovary: Measures 3.9 x 2.6 x 2.2 cm.  Multiple small peripheral follicles are noted.  Other findings: No free fluid  IMPRESSION:  1.  Normal sonographic appearance of the uterus. 2.  Numerous small similar sized peripheral ovarian follicles. Polycystic ovarian syndrome would be a consideration.  Original Report Authenticated By: P. Loralie Champagne, M.D.     Assessment: 1. PCOS (polycystic ovarian syndrome)  Referral to Nutrition and Diabetes Services     Plan: Weight loss strongly encouraged Rx Metformin 1gm BID Referral to Nutritional services  Will continue provera every other month for amenorrhea RTC 3 months or prn  [name] E. 10/12/2011,1:52 PM

## 2011-11-03 ENCOUNTER — Encounter: Payer: Self-pay | Admitting: *Deleted

## 2011-11-03 ENCOUNTER — Encounter: Payer: Medicaid Other | Attending: Physician Assistant | Admitting: *Deleted

## 2011-11-03 NOTE — Patient Instructions (Addendum)
Plan: Aim for 2-3 carb choices (30-45 grams) per meal Aim for 0-1  carb choices (30-45 grams) per snack if hungry Consider ReliaOn Meter for less expensive strips Consider reading food labels for Total Carbohydrate and Fat grams of foods Continue checking BG's at alternate times of day Continue excellent variety of food choices and high fiber foods

## 2011-11-03 NOTE — Progress Notes (Signed)
  Medical Nutrition Therapy:  Appt start time: 0800 end time:  0900.  Assessment:  Primary concerns today: patient here for assistance with obesity. She states she has classic symptoms of PCOS, is taking 0100 mg Metformin twice a day and does have symptoms of GI distress with the Metformin. She states she does take it with food, but still has major problems with diarrhea. She is in school getting her degree in Business Administration with on-line classes. She is married and has 2 pre-teen boys at home. Her father has encouraged her to check her BG to be on the watch for diabetes due to her family history and she states she tests 2-3 times a day with stated range being 90-175 mg/dl.  MEDICATIONS: see list   DIETARY INTAKE:  Usual eating pattern includes 3 meals and 0 snacks per day.  Everyday foods include good variety of all food groups.  Avoided foods include high fat and high sugar foods.    24-hr recall:  B ( AM): Nutri-grain bar and yogurt and 12 oz 1% milk  Snk ( AM): none  L ( PM): tuna on crackers or sandwich, banana, 8 oz OJ Snk ( PM): occasionally fresh fruit or carrots D ( PM): soup with rice, black beans and tortillas, chicken or salmon, water Snk ( PM): none Beverages: fruit juice, 1% milk, water  Usual physical activity: 30 minutes or more a day x 5 days a week walking or riding a bike  Estimated energy needs: 1400 calories 158 g carbohydrates 105 g protein 39 g fat  Progress Towards Goal(s):  In progress.   Nutritional Diagnosis:  NI-1.5 Excessive energy intake As related to activity level.  As evidenced by BMI of 36.9%.    Intervention:  Nutrition counseling provided on PCOS and weight loss. Discussed that Type 2 Diabetes risk is high with PCOS and provided her with the diagnostic BG and A1c criteria for pre-diabetes as well.  Plan: Aim for 2-3 carb choices (30-45 grams) per meal Aim for 0-1  carb choices (30-45 grams) per snack if hungry Consider ReliaOn Meter  for less expensive strips Consider reading food labels for Total Carbohydrate and Fat grams of foods Continue checking BG's at alternate times of day and report to MD Continue with current activity level, good job! Continue excellent variety of food choices and high fiber foods  Handouts given during visit include: Living Well with Diabetes Carb Counting and Food Label handouts Meal Plan Card PCOS handout  ReliaON meter price information  Monitoring/Evaluation:  Dietary intake, exercise, reading food labels, SMBG, and body weight prn. Patient to call to make appointment when she can check her calendar at home.

## 2012-01-15 ENCOUNTER — Inpatient Hospital Stay (HOSPITAL_COMMUNITY): Payer: Medicaid Other

## 2012-01-15 ENCOUNTER — Inpatient Hospital Stay (HOSPITAL_COMMUNITY)
Admission: AD | Admit: 2012-01-15 | Discharge: 2012-01-15 | Disposition: A | Discharge status: Home / Self Care | Payer: Medicaid Other | Source: Ambulatory Visit | Attending: Obstetrics & Gynecology | Admitting: Obstetrics & Gynecology

## 2012-01-15 ENCOUNTER — Encounter (HOSPITAL_COMMUNITY): Payer: Self-pay | Admitting: *Deleted

## 2012-01-15 DIAGNOSIS — R319 Hematuria, unspecified: Secondary | ICD-10-CM | POA: Insufficient documentation

## 2012-01-15 DIAGNOSIS — R1031 Right lower quadrant pain: Secondary | ICD-10-CM | POA: Insufficient documentation

## 2012-01-15 DIAGNOSIS — M549 Dorsalgia, unspecified: Secondary | ICD-10-CM | POA: Insufficient documentation

## 2012-01-15 DIAGNOSIS — N200 Calculus of kidney: Secondary | ICD-10-CM

## 2012-01-15 HISTORY — DX: Polycystic ovarian syndrome: E28.2

## 2012-01-15 LAB — URINALYSIS, ROUTINE W REFLEX MICROSCOPIC
Glucose, UA: NEGATIVE mg/dL
Ketones, ur: NEGATIVE mg/dL
Protein, ur: 30 mg/dL — AB
Urobilinogen, UA: 0.2 mg/dL (ref 0.0–1.0)

## 2012-01-15 LAB — CBC
HCT: 39.7 % (ref 36.0–46.0)
MCH: 29.9 pg (ref 26.0–34.0)
MCHC: 32.7 g/dL (ref 30.0–36.0)
MCV: 91.3 fL (ref 78.0–100.0)
Platelets: 287 10*3/uL (ref 150–400)
RDW: 12.7 % (ref 11.5–15.5)

## 2012-01-15 LAB — WET PREP, GENITAL
Trich, Wet Prep: NONE SEEN
Yeast Wet Prep HPF POC: NONE SEEN

## 2012-01-15 MED ORDER — OXYCODONE-ACETAMINOPHEN 7.5-325 MG PO TABS: 1.0000 | ORAL_TABLET | ORAL | Status: DC | PRN

## 2012-01-15 MED ORDER — SODIUM CHLORIDE 0.9 % IV SOLN
INTRAVENOUS | Status: DC
  Administered 2012-01-15: 20:00:00 via INTRAVENOUS

## 2012-01-15 MED ORDER — HYDROMORPHONE HCL PF 1 MG/ML IJ SOLN
1.0000 mg | Freq: Once | INTRAMUSCULAR | Status: AC
  Administered 2012-01-15: 1 mg via INTRAVENOUS
  Filled 2012-01-15: qty 1

## 2012-01-15 MED ORDER — TAMSULOSIN HCL 0.4 MG PO CAPS: 0.4000 mg | ORAL_CAPSULE | Freq: Every day | ORAL | Status: DC

## 2012-01-15 MED ORDER — PROMETHAZINE HCL 25 MG PO TABS: 12.5000 mg | ORAL_TABLET | Freq: Four times a day (QID) | ORAL | Status: DC | PRN

## 2012-01-15 MED ORDER — TAMSULOSIN HCL 0.4 MG PO CAPS
0.4000 mg | ORAL_CAPSULE | Freq: Once | ORAL | Status: AC
  Administered 2012-01-15: 0.4 mg via ORAL
  Filled 2012-01-15: qty 1

## 2012-01-15 MED ORDER — KETOROLAC TROMETHAMINE 60 MG/2ML IM SOLN
60.0000 mg | Freq: Once | INTRAMUSCULAR | Status: AC
  Administered 2012-01-15: 60 mg via INTRAMUSCULAR
  Filled 2012-01-15: qty 2

## 2012-01-15 MED ORDER — ONDANSETRON HCL 4 MG/2ML IJ SOLN
4.0000 mg | Freq: Once | INTRAMUSCULAR | Status: AC
  Administered 2012-01-15: 4 mg via INTRAVENOUS
  Filled 2012-01-15: qty 2

## 2012-01-15 NOTE — MAU Provider Note (Signed)
Attestation of Attending Supervision of Advanced Practitioner (CNM/NP): Evaluation and management procedures were performed by the Advanced Practitioner under my supervision and collaboration.  I have reviewed the Advanced Practitioner's note and chart, and I agree with the management and plan.  HARRAWAY-SMITH, Alegandro Macnaughton 11:44 PM     

## 2012-01-15 NOTE — MAU Note (Signed)
About 3 hours ago started to get sharp pains in lower back on the right side.  Now the pain is in the back and abdomen. Pain a 10/10 very unbearable.

## 2012-01-15 NOTE — MAU Provider Note (Signed)
History     CSN: [id number]  Arrival date and time: 01/15/12 4098   First Provider Initiated Contact with Patient 01/15/12 1927      Chief Complaint  Patient presents with  . Abdominal Pain  . Back Pain   HPI 26 y.o. [id number] with right sided back pain moving around to RLQ starting a few hours ago, + nausea/vomiting, denies fever, chills, dysuria, constipation, diarrhea.    Past Medical History  Diagnosis Date  . Asthma   . Obesity   . PCOS (polycystic ovarian syndrome)     History reviewed. No pertinent past surgical history.  Family History  Problem Relation Age of Onset  . Hypertension Maternal Grandmother   . Thyroid disease Maternal Grandmother   . Diabetes Father     History  Substance Use Topics  . Smoking status: Never Smoker   . Smokeless tobacco: Never Used  . Alcohol Use: Yes     occassionally beer once every 2 weeks or wine less than once a month    Allergies: No Known Allergies  Prescriptions prior to admission  Medication Sig Dispense Refill  . ibuprofen (ADVIL,MOTRIN) 200 MG tablet Take 600 mg by mouth every 6 (six) hours as needed. pain      . metFORMIN (GLUCOPHAGE) 1000 MG tablet Take 1,000 mg by mouth daily.      . Multiple Vitamin (MULITIVITAMIN WITH MINERALS) TABS Take 1 tablet by mouth daily.      Marland Kitchen PREDNISONE, PAK, PO Take 1 tablet by mouth as directed. Pt states she is taking a predisone dose pack x 6 days; pharmacy has no record to confirm strength.  Started 01/12/12        Review of Systems  Constitutional: Negative.   Respiratory: Negative.   Cardiovascular: Negative.   Gastrointestinal: Positive for nausea, vomiting and abdominal pain. Negative for diarrhea and constipation.  Genitourinary: Negative for dysuria, urgency, frequency, hematuria and flank pain.       Positive for spotting   Musculoskeletal: Positive for back pain.  Neurological: Negative.   Psychiatric/Behavioral: Negative.    Physical Exam   Blood pressure 147/86,  pulse 75, temperature 98.6 F (37 C), temperature source Oral, resp. rate 20, last menstrual period 11/30/2011.  Physical Exam  Nursing note and vitals reviewed. Constitutional: She is oriented to person, place, and time. She appears well-developed and well-nourished. She appears distressed.  Cardiovascular: Normal rate.   Respiratory: Effort normal.  GI: Soft. There is no tenderness. There is CVA tenderness.  Musculoskeletal: Normal range of motion.  Neurological: She is alert and oriented to person, place, and time.  Skin: Skin is warm and dry.  Psychiatric: She has a normal mood and affect.    MAU Course  Procedures  Results for orders placed during the hospital encounter of 01/15/12 (from the past 24 hour(s))  URINALYSIS, ROUTINE W REFLEX MICROSCOPIC     Status: Abnormal   Collection Time   01/15/12  7:03 PM      Component Value Range   Color, Urine YELLOW  YELLOW   APPearance CLOUDY (*) CLEAR   Specific Gravity, Urine >1.030 (*) 1.005 - 1.030   pH 6.0  5.0 - 8.0   Glucose, UA NEGATIVE  NEGATIVE mg/dL   Hgb urine dipstick LARGE (*) NEGATIVE   Bilirubin Urine NEGATIVE  NEGATIVE   Ketones, ur NEGATIVE  NEGATIVE mg/dL   Protein, ur 30 (*) NEGATIVE mg/dL   Urobilinogen, UA 0.2  0.0 - 1.0 mg/dL   Nitrite NEGATIVE  NEGATIVE   Leukocytes, UA NEGATIVE  NEGATIVE  URINE MICROSCOPIC-ADD ON     Status: Abnormal   Collection Time   01/15/12  7:03 PM      Component Value Range   Squamous Epithelial / LPF FEW (*) RARE   WBC, UA 0-2  <3 WBC/hpf   RBC / HPF TOO NUMEROUS TO COUNT  <3 RBC/hpf   Bacteria, UA MANY (*) RARE  POCT PREGNANCY, URINE     Status: Normal   Collection Time   01/15/12  7:10 PM      Component Value Range   Preg Test, Ur NEGATIVE  NEGATIVE  CBC     Status: Abnormal   Collection Time   01/15/12  7:13 PM      Component Value Range   WBC 12.8 (*) 4.0 - 10.5 K/uL   RBC 4.35  3.87 - 5.11 MIL/uL   Hemoglobin 13.0  12.0 - 15.0 g/dL   HCT 16.1  09.6 - 04.5 %   MCV 91.3   78.0 - 100.0 fL   MCH 29.9  26.0 - 34.0 pg   MCHC 32.7  30.0 - 36.0 g/dL   RDW 40.9  81.1 - 91.4 %   Platelets 287  150 - 400 K/uL  WET PREP, GENITAL     Status: Abnormal   Collection Time   01/15/12  8:00 PM      Component Value Range   Yeast Wet Prep HPF POC NONE SEEN  NONE SEEN   Trich, Wet Prep NONE SEEN  NONE SEEN   Clue Cells Wet Prep HPF POC FEW (*) NONE SEEN   WBC, Wet Prep HPF POC MODERATE (*) NONE SEEN     Assessment and Plan  Right back/flank pain with hematuria - toradol 60 mg IM, CT ordered to r/o kidney stones Care assumed by [facility], NP  Juanjesus Pepperman 01/15/2012, 9:45 PM   20:00 pm Patient awaiting CT scan. Re evaluation: Patient rates her pain as 4/10 after Toradol 60 mg. IM. No birth control, periods irregular for the past few months. No history of STI's, last pap smear 2011 and was normal. No family history of kidney stones.  Exam: Abdomen soft, non tender with palpation. External genitalia without lesions. Small blood vaginal vault. Cervix friable, No CMT, no adnexal tenderness, uterus without palpable enlargement.  Prior to going to CT patient's pain returned and she began vomiting. IV of LR started and patient given Dilaudid 1 mg. IV and zofran 4 mg. IV.   Ct Abdomen Pelvis Wo Contrast  01/15/2012  *RADIOLOGY REPORT*  Clinical Data: Right flank pain and hematuria.  CT ABDOMEN AND PELVIS WITHOUT CONTRAST  Technique:  Multidetector CT imaging of the abdomen and pelvis was performed following the standard protocol without intravenous contrast.  Comparison: Pelvic ultrasound dated 09/28/2011.  Findings: 3 mm calculus in the proximal left ureter with moderate to marked dilatation of the right renal collecting system.  No distal right ureteral calculi seen.  No renal calculi.  Normal appearing left kidney, left ureter and urinary bladder.  The bladder is poorly distended.  The ureteral calculus is not visible on the scout image.  Unremarkable noncontrasted appearance of the  liver, spleen, pancreas, gallbladder, adrenal glands, uterus and ovaries.  No gastrointestinal abnormalities or enlarged lymph nodes.  Normal appearing appendix in the right pelvis.  Clear lung bases.  Normal appearing bones.  IMPRESSION: 3 mm proximal right ureteral calculus causing moderate to marked right hydronephrosis.   Original Report Authenticated By:  Darrol Angel, M.D.    Discussed with Dr. Erin Fulling, will continue to IV hydrate with a second bag of fluid and d/c patient home with strainer, percocet  and Flonase. Patient to follow up with urologist.   Assessment: Kidney stone  Plan:  Strain urine   Pain management   Nausea management   Urology follow up    Discussed with the patient and all questioned fully answered. She will follow up with [facility] she will go to [facility] or [facility] ED if any problems arise. Medication List  As of 01/15/2012 11:19 PM   START taking these medications         oxyCODONE-acetaminophen 7.5-325 MG per tablet   Commonly known as: PERCOCET   Take 1 tablet by mouth every 4 (four) hours as needed for pain.      promethazine 25 MG tablet   Commonly known as: PHENERGAN   Take 0.5 tablets (12.5 mg total) by mouth every 6 (six) hours as needed for nausea.      Tamsulosin HCl 0.4 MG Caps   Commonly known as: FLOMAX   Take 1 capsule (0.4 mg total) by mouth daily.         CONTINUE taking these medications         metFORMIN 1000 MG tablet   Commonly known as: GLUCOPHAGE      multivitamin with minerals Tabs      PREDNISONE (PAK) PO         STOP taking these medications         ibuprofen 200 MG tablet          Where to get your medications    These are the prescriptions that you need to pick up. We sent them to a specific pharmacy, so you will need to go there to get them.   [id number] - [place], [place] - [address] AT [facility] [address]    [address] [place] [address]     Phone: [phone]        promethazine 25 MG tablet         You may get these medications from any pharmacy.         oxyCODONE-acetaminophen 7.5-325 MG per tablet   Tamsulosin HCl 0.4 MG Caps           Follow-up Information    Schedule an appointment as soon as possible for a visit with Kathi Ludwig, MD.   Contact information:   [address] [place], [address] [facility] [place] [address] [phone]

## 2012-01-16 LAB — GC/CHLAMYDIA PROBE AMP, GENITAL
Chlamydia, DNA Probe: NEGATIVE
GC Probe Amp, Genital: NEGATIVE

## 2012-01-23 ENCOUNTER — Encounter (HOSPITAL_COMMUNITY): Payer: Self-pay

## 2012-01-23 ENCOUNTER — Emergency Department (HOSPITAL_COMMUNITY)
Admission: EM | Admit: 2012-01-23 | Discharge: 2012-01-23 | Discharge status: Against Medical Advice | Payer: Medicaid Other | Attending: Emergency Medicine | Admitting: Emergency Medicine

## 2012-01-23 DIAGNOSIS — R109 Unspecified abdominal pain: Secondary | ICD-10-CM | POA: Insufficient documentation

## 2012-01-23 DIAGNOSIS — R1110 Vomiting, unspecified: Secondary | ICD-10-CM | POA: Insufficient documentation

## 2012-01-23 NOTE — ED Notes (Signed)
Patient states she went to [facility] and ws told she had a kidney stone. Patient states that she did pass the stone, but is still having pain in the right flank area and vomiting. Patient states she has vomited x 3 today. Patient denies any dysuria or blood in urine. Patient states she is out of her medicines that were prescribed. ( Oxycodone, phenergan, flomax}

## 2012-01-23 NOTE — ED Notes (Signed)
Pt called from waiting room x 3 with no answer. Pt will be discharged from ED

## 2012-01-23 NOTE — ED Notes (Signed)
Pt was called 2X for room, pt was not in the waiting area.

## 2012-01-26 ENCOUNTER — Emergency Department (HOSPITAL_COMMUNITY)
Admission: EM | Admit: 2012-01-26 | Discharge: 2012-01-26 | Disposition: A | Discharge status: Home / Self Care | Payer: Medicaid Other | Attending: Emergency Medicine | Admitting: Emergency Medicine

## 2012-01-26 ENCOUNTER — Encounter (HOSPITAL_COMMUNITY): Payer: Self-pay | Admitting: Emergency Medicine

## 2012-01-26 DIAGNOSIS — R109 Unspecified abdominal pain: Secondary | ICD-10-CM | POA: Insufficient documentation

## 2012-01-26 DIAGNOSIS — Z79899 Other long term (current) drug therapy: Secondary | ICD-10-CM | POA: Insufficient documentation

## 2012-01-26 DIAGNOSIS — R319 Hematuria, unspecified: Secondary | ICD-10-CM | POA: Insufficient documentation

## 2012-01-26 LAB — URINALYSIS, ROUTINE W REFLEX MICROSCOPIC
Bilirubin Urine: NEGATIVE
Ketones, ur: NEGATIVE mg/dL
Protein, ur: NEGATIVE mg/dL
Specific Gravity, Urine: 1.019 (ref 1.005–1.030)
Urobilinogen, UA: 1 mg/dL (ref 0.0–1.0)

## 2012-01-26 LAB — URINE MICROSCOPIC-ADD ON

## 2012-01-26 MED ORDER — TAMSULOSIN HCL 0.4 MG PO CAPS: 0.4000 mg | ORAL_CAPSULE | Freq: Every day | ORAL | Status: DC

## 2012-01-26 MED ORDER — PROMETHAZINE HCL 25 MG PO TABS: 25.0000 mg | ORAL_TABLET | Freq: Four times a day (QID) | ORAL | Status: DC | PRN

## 2012-01-26 NOTE — ED Provider Notes (Signed)
History     CSN: [id number]  Arrival date & time 01/26/12  0801   First MD Initiated Contact with Patient 01/26/12 [phone]      Chief Complaint  Patient presents with  . Flank Pain    (Consider location/radiation/quality/duration/timing/severity/associated sxs/prior treatment) HPI  26 year old female with history of kidney stones last diagnosed earlier this month is presenting complaining of right flank pain.  Pt reports she was diagnosed with R side kidney stones 2 weeks ago, and were able to pass the stone about a week ago.  Continue to endorse R flank pain since.  Describe as a cramping sensation, persistent, and unrelieved with pain medication.  Sts she also has 1-2 bouts of vomits per day, and decreased appetite.  Also noticed a faint tint of blood in her vomit today.  Has the urge to urinate but sts "it doesn't come out".  Denies burning on urination, but does endorse blood in urine.  Denies fever, cp, sob, back pain, or rash.  LMP on July 23rd, but sts her menstruation comes every 2 months.  Has negative preg test 2 weeks ago, has not been sexually active since.  Sts she was seen by Dr. Marcello Fennel (urologist) yesterday and was recommend watchful waiting to to f/u in 1 wk.    Past Medical History  Diagnosis Date  . Asthma   . Obesity   . PCOS (polycystic ovarian syndrome)     History reviewed. No pertinent past surgical history.  Family History  Problem Relation Age of Onset  . Hypertension Maternal Grandmother   . Thyroid disease Maternal Grandmother   . Diabetes Father     History  Substance Use Topics  . Smoking status: Never Smoker   . Smokeless tobacco: Never Used  . Alcohol Use: Yes     occassionally beer once every 2 weeks or wine less than once a month    OB History    Grav Para Term Preterm Abortions TAB SAB Ect Mult Living   2 2 2       2       Review of Systems  All other systems reviewed and are negative.    Allergies  Review of patient's allergies  indicates no known allergies.  Home Medications   Current Outpatient Rx  Name Route Sig Dispense Refill  . METFORMIN HCL 1000 MG PO TABS Oral Take 1,000 mg by mouth daily.    . ADULT MULTIVITAMIN W/MINERALS CH Oral Take 1 tablet by mouth daily.    . OXYCODONE-ACETAMINOPHEN 7.5-325 MG PO TABS Oral Take 1 tablet by mouth every 4 (four) hours as needed.      BP 128/70  Temp 99.1 F (37.3 C) (Oral)  SpO2 97%  LMP 11/30/2011  Physical Exam  Nursing note and vitals reviewed. Constitutional: She appears well-developed and well-nourished. No distress.       Awake, alert, nontoxic appearance  HENT:  Head: Atraumatic.  Eyes: Conjunctivae normal are normal. Right eye exhibits no discharge. Left eye exhibits no discharge.  Neck: Neck supple.  Cardiovascular: Normal rate and regular rhythm.   Pulmonary/Chest: Effort normal. No respiratory distress. She exhibits no tenderness.  Abdominal: Soft. There is no splenomegaly or hepatomegaly. There is no tenderness. There is no rebound and no CVA tenderness. No hernia.  Musculoskeletal: She exhibits no tenderness.       ROM appears intact, no obvious focal weakness  Neurological: She is alert.       Mental status and motor strength appears intact  Skin: No rash noted.  Psychiatric: She has a normal mood and affect.    ED Course  Procedures (including critical care time)   Labs Reviewed  URINALYSIS, ROUTINE W REFLEX MICROSCOPIC   Results for orders placed during the hospital encounter of 01/26/12  URINALYSIS, ROUTINE W REFLEX MICROSCOPIC      Component Value Range   Color, Urine YELLOW  YELLOW   APPearance CLOUDY (*) CLEAR   Specific Gravity, Urine 1.019  1.005 - 1.030   pH 5.5  5.0 - 8.0   Glucose, UA NEGATIVE  NEGATIVE mg/dL   Hgb urine dipstick LARGE (*) NEGATIVE   Bilirubin Urine NEGATIVE  NEGATIVE   Ketones, ur NEGATIVE  NEGATIVE mg/dL   Protein, ur NEGATIVE  NEGATIVE mg/dL   Urobilinogen, UA 1.0  0.0 - 1.0 mg/dL   Nitrite  NEGATIVE  NEGATIVE   Leukocytes, UA SMALL (*) NEGATIVE  URINE MICROSCOPIC-ADD ON      Component Value Range   Squamous Epithelial / LPF RARE  RARE   WBC, UA 0-2  <3 WBC/hpf   RBC / HPF TOO NUMEROUS TO COUNT  <3 RBC/hpf   Bacteria, UA RARE  RARE   Ct Abdomen Pelvis Wo Contrast  01/15/2012  *RADIOLOGY REPORT*  Clinical Data: Right flank pain and hematuria.  CT ABDOMEN AND PELVIS WITHOUT CONTRAST  Technique:  Multidetector CT imaging of the abdomen and pelvis was performed following the standard protocol without intravenous contrast.  Comparison: Pelvic ultrasound dated 09/28/2011.  Findings: 3 mm calculus in the proximal left ureter with moderate to marked dilatation of the right renal collecting system.  No distal right ureteral calculi seen.  No renal calculi.  Normal appearing left kidney, left ureter and urinary bladder.  The bladder is poorly distended.  The ureteral calculus is not visible on the scout image.  Unremarkable noncontrasted appearance of the liver, spleen, pancreas, gallbladder, adrenal glands, uterus and ovaries.  No gastrointestinal abnormalities or enlarged lymph nodes.  Normal appearing appendix in the right pelvis.  Clear lung bases.  Normal appearing bones.  IMPRESSION: 3 mm proximal right ureteral calculus causing moderate to marked right hydronephrosis.   Original Report Authenticated By: Darrol Angel, M.D.     1. R flank pain 2. hematuria  MDM  C/o R flank and suprapubic abd pain since passing her kidney stone a week ago.  Pain nonreproducible on exam, abd nonsurgical.  Is afebrile, vss.  Will check UA to r/o infection.  If neg, will prescribe antiemetic and have pt f/u with urologist as previously recommended.    9:12 AM UA shows Hgb, but no signs of infection.  Therefore, i recommend for pt to f/u with her urologist next week if pain persists.  Will give antiemetic.    BP 128/70  Temp 99.1 F (37.3 C) (Oral)  SpO2 97%  LMP 11/30/2011  Nursing notes reviewed  and considered in documentation  Previous records reviewed and considered  All labs/vitals reviewed and considered  xrays reviewed and considered     Fayrene Helper, PA-C 01/26/12 4540

## 2012-01-26 NOTE — ED Notes (Addendum)
Pt states she was dx with Kidney Stones at [facility] 2 weeks ago. Was told she had 2 stones in R kidney, none in L. States she passed both kidney stones about a week ago but is still having R flank pain in same location. Pain 8/10. States +blood in urine. States "I feel like I have to urinate but it doesn't come out." Pt states she took 1 percoset around 1500 yesterday but "it didn't do anything" so she didn't take any today.

## 2012-01-30 NOTE — ED Provider Notes (Signed)
Medical screening examination/treatment/procedure(s) were performed by non-physician practitioner and as supervising physician I was immediately available for consultation/collaboration.   Suzi Roots, MD 01/30/12 [phone]

## 2013-04-26 ENCOUNTER — Ambulatory Visit: Payer: Medicaid Other | Attending: Internal Medicine

## 2013-06-03 ENCOUNTER — Encounter: Payer: Self-pay | Admitting: Internal Medicine

## 2013-06-03 ENCOUNTER — Ambulatory Visit: Payer: No Typology Code available for payment source | Attending: Internal Medicine | Admitting: Internal Medicine

## 2013-06-03 VITALS — BP 128/92 | HR 82 | Temp 98.3°F | Resp 17 | Wt 224.0 lb

## 2013-06-03 DIAGNOSIS — Z23 Encounter for immunization: Secondary | ICD-10-CM

## 2013-06-03 DIAGNOSIS — Z139 Encounter for screening, unspecified: Secondary | ICD-10-CM

## 2013-06-03 DIAGNOSIS — J45909 Unspecified asthma, uncomplicated: Secondary | ICD-10-CM

## 2013-06-03 DIAGNOSIS — N912 Amenorrhea, unspecified: Secondary | ICD-10-CM

## 2013-06-03 DIAGNOSIS — E282 Polycystic ovarian syndrome: Secondary | ICD-10-CM

## 2013-06-03 LAB — LIPID PANEL
Cholesterol: 175 mg/dL (ref 0–200)
HDL: 41 mg/dL (ref >39)
LDL CALC: 97 mg/dL (ref 0–99)
TRIGLYCERIDES: 187 mg/dL — AB (ref <150)
Total CHOL/HDL Ratio: 4.3 Ratio
VLDL: 37 mg/dL (ref 0–40)

## 2013-06-03 LAB — CBC WITH DIFFERENTIAL/PLATELET
BASOS ABS: 0 10*3/uL (ref 0.0–0.1)
BASOS PCT: 0 % (ref 0–1)
EOS ABS: 0.3 10*3/uL (ref 0.0–0.7)
Eosinophils Relative: 5 % (ref 0–5)
HEMATOCRIT: 43 % (ref 36.0–46.0)
Hemoglobin: 14.4 g/dL (ref 12.0–15.0)
Lymphocytes Relative: 51 % — ABNORMAL HIGH (ref 12–46)
Lymphs Abs: 3 10*3/uL (ref 0.7–4.0)
MCH: 30.5 pg (ref 26.0–34.0)
MCHC: 33.5 g/dL (ref 30.0–36.0)
MCV: 91.1 fL (ref 78.0–100.0)
MONO ABS: 0.4 10*3/uL (ref 0.1–1.0)
Monocytes Relative: 6 % (ref 3–12)
NEUTROS ABS: 2.3 10*3/uL (ref 1.7–7.7)
Neutrophils Relative %: 38 % — ABNORMAL LOW (ref 43–77)
Platelets: 266 10*3/uL (ref 150–400)
RBC: 4.72 MIL/uL (ref 3.87–5.11)
RDW: 12.3 % (ref 11.5–15.5)
WBC: 5.9 10*3/uL (ref 4.0–10.5)

## 2013-06-03 LAB — COMPLETE METABOLIC PANEL WITH GFR
ALBUMIN: 4.3 g/dL (ref 3.5–5.2)
ALK PHOS: 70 U/L (ref 39–117)
ALT: 15 U/L (ref 0–35)
AST: 15 U/L (ref 0–37)
BILIRUBIN TOTAL: 0.3 mg/dL (ref 0.3–1.2)
BUN: 7 mg/dL (ref 6–23)
CO2: 28 mEq/L (ref 19–32)
CREATININE: 0.68 mg/dL (ref 0.50–1.10)
Calcium: 9.3 mg/dL (ref 8.4–10.5)
Chloride: 104 mEq/L (ref 96–112)
GFR, Est African American: >89 mL/min
GLUCOSE: 89 mg/dL (ref 70–99)
POTASSIUM: 4 meq/L (ref 3.5–5.3)
Sodium: 138 mEq/L (ref 135–145)
Total Protein: 7 g/dL (ref 6.0–8.3)

## 2013-06-03 NOTE — Progress Notes (Signed)
Patient Demographics  Tanya Hayes, is a 28 y.o. female  ZOX:[id number]  [id number]:[id number]  DOB - 02/15/1986  CC:  Chief Complaint  Patient presents with  . Establish Care       HPI: Tanya Hayes is a 28 y.o. female here today to establish medical care. She has history of asthma which is well controlled once in a while she use albuterol, she denies smoking cigarettes, she has also diagnosis of PCO S. and was following up with gynecologist in the past, she lost to follow up because of insurance issues, she is applying for orange card, she also saw a dietitian in the past was taking metformin and has not taken it because of side effects she cannot tolerate. Patient has No headache, No chest pain, No abdominal pain - No Nausea, No new weakness tingling or numbness, No Cough - SOB.  No Known Allergies Past Medical History  Diagnosis Date  . Asthma   . Obesity   . PCOS (polycystic ovarian syndrome)    Current Outpatient Prescriptions on File Prior to Visit  Medication Sig Dispense Refill  . albuterol (PROVENTIL HFA;VENTOLIN HFA) 108 (90 BASE) MCG/ACT inhaler Inhale 2 puffs into the lungs every 6 (six) hours as needed. For wheezing.      . metFORMIN (GLUCOPHAGE) 1000 MG tablet Take 1,000 mg by mouth daily.      . Multiple Vitamin (MULITIVITAMIN WITH MINERALS) TABS Take 1 tablet by mouth every morning.       Marland Kitchen. oxyCODONE-acetaminophen (PERCOCET) 7.5-325 MG per tablet Take 1 tablet by mouth every 4 (four) hours as needed. For pain.      . promethazine (PHENERGAN) 25 MG tablet Take 1 tablet (25 mg total) by mouth every 6 (six) hours as needed for nausea.  20 tablet  0  . Tamsulosin HCl (FLOMAX) 0.4 MG CAPS Take 1 capsule (0.4 mg total) by mouth daily.  10 capsule  0   No current facility-administered medications on file prior to visit.   Family History  Problem Relation Age of Onset  . Hypertension Maternal Grandmother   . Thyroid disease Maternal Grandmother   . Stroke Maternal  Grandmother   . Diabetes Father   . Stroke Maternal Grandfather    History   Social History  . Marital Status: Married    Spouse Name: N/A    Number of Children: N/A  . Years of Education: N/A   Occupational History  . Not on file.   Social History Main Topics  . Smoking status: Never Smoker   . Smokeless tobacco: Never Used  . Alcohol Use: Yes     Comment: occassionally beer once every 2 weeks or wine less than once a month  . Drug Use: No  . Sexual Activity: Yes    Birth Control/ Protection: None   Other Topics Concern  . Not on file   Social History Narrative  . No narrative on file    Review of Systems: Constitutional: Negative for fever, chills, diaphoresis, activity change, appetite change and fatigue. HENT: Negative for ear pain, nosebleeds, congestion, facial swelling, rhinorrhea, neck pain, neck stiffness and ear discharge.  Eyes: Negative for pain, discharge, redness, itching and visual disturbance. Respiratory: Negative for cough, choking, chest tightness, shortness of breath, wheezing and stridor.  Cardiovascular: Negative for chest pain, palpitations and leg swelling. Gastrointestinal: Negative for abdominal distention. Genitourinary: Negative for dysuria, urgency, frequency, hematuria, flank pain, decreased urine volume, difficulty urinating and dyspareunia.  Musculoskeletal: Negative for back pain, joint swelling, arthralgia  and gait problem. Neurological: Negative for dizziness, tremors, seizures, syncope, facial asymmetry, speech difficulty, weakness, light-headedness, numbness and headaches.  Hematological: Negative for adenopathy. Does not bruise/bleed easily. Psychiatric/Behavioral: Negative for hallucinations, behavioral problems, confusion, dysphoric mood, decreased concentration and agitation.    Objective:   Filed Vitals:   06/03/13 1057  BP: 128/92  Pulse: 82  Temp: 98.3 F (36.8 C)  Resp: 17    Physical Exam: Constitutional: Obese  female sitting comfortably not in acute distress.  HENT: Normocephalic, atraumatic, External right and left ear normal. Oropharynx is clear and moist.  Eyes: Conjunctivae and EOM are normal. PERRLA, no scleral icterus. Neck: Normal ROM. Neck supple. No JVD. No tracheal deviation. No thyromegaly. CVS: RRR, S1/S2 +, no murmurs, no gallops, no carotid bruit.  Pulmonary: Effort and breath sounds normal, no stridor, rhonchi, wheezes, rales.  Abdominal: Soft. BS +, no distension, tenderness, rebound or guarding.  Musculoskeletal: Normal range of motion. No edema and no tenderness.  Psychiatric: Normal mood and affect. Behavior, judgment, thought content normal.  Lab Results  Component Value Date   WBC 12.8* 01/15/2012   HGB 13.0 01/15/2012   HCT 39.7 01/15/2012   MCV 91.3 01/15/2012   PLT 287 01/15/2012   Lab Results  Component Value Date   CREATININE 0.80 01/07/2011   BUN 5* 01/07/2011   NA 141 01/07/2011   K 3.6 01/07/2011   CL 106 01/07/2011    No results found for this basename: HGBA1C   Lipid Panel  No results found for this basename: chol, trig, hdl, cholhdl, vldl, ldlcalc       Assessment and plan:   1. PCOS (polycystic ovarian syndrome)  - Ambulatory referral to Gynecology  2. Unspecified asthma(493.90) Albuterol when necessary.  3. Amenorrhea  - Ambulatory referral to Gynecology  4. Needs flu shot Flu shot today  5. Screening Baseline blood work.  - CBC with Differential - COMPLETE METABOLIC PANEL WITH GFR - TSH - Lipid panel - Vit D  25 hydroxy (rtn osteoporosis monitoring)   Return in about 6 weeks (around 07/15/2013).   Doris Cheadle, MD

## 2013-06-03 NOTE — Progress Notes (Signed)
Patient here to establish care Has history of Asthma and PCOS Has not had a period since march due to her syndrome

## 2013-06-04 ENCOUNTER — Telehealth: Payer: Self-pay | Admitting: *Deleted

## 2013-06-04 LAB — TSH: TSH: 1.535 u[IU]/mL (ref 0.350–4.500)

## 2013-06-04 LAB — VITAMIN D 25 HYDROXY (VIT D DEFICIENCY, FRACTURES): Vit D, 25-Hydroxy: 21 ng/mL — ABNORMAL LOW (ref 30–89)

## 2013-06-04 NOTE — Telephone Encounter (Signed)
Message copied by Janylah Belgrave, [place] R on Tue Jun 04, 2013 11:41 AM ------      Message from: Doris CheadleADVANI, DEEPAK      Created: Tue Jun 04, 2013 11:02 AM       Blood work reviewed, noticed low vitamin D, call patient advise to start ergocalciferol 50,000 units once a week for the duration of  12 weeks.       noticed elevated triglycerides, advise patient for low fat diet.        ------

## 2013-06-04 NOTE — Telephone Encounter (Signed)
Left message 1st attempt.  

## 2013-06-18 ENCOUNTER — Encounter: Payer: Self-pay | Admitting: Obstetrics & Gynecology

## 2013-07-15 ENCOUNTER — Encounter: Payer: Self-pay | Admitting: Internal Medicine

## 2013-07-15 ENCOUNTER — Ambulatory Visit: Payer: No Typology Code available for payment source | Attending: Internal Medicine | Admitting: Internal Medicine

## 2013-07-15 VITALS — BP 133/81 | HR 78 | Temp 98.2°F | Resp 16 | Wt 222.2 lb

## 2013-07-15 DIAGNOSIS — E559 Vitamin D deficiency, unspecified: Secondary | ICD-10-CM | POA: Insufficient documentation

## 2013-07-15 DIAGNOSIS — E781 Pure hyperglyceridemia: Secondary | ICD-10-CM | POA: Insufficient documentation

## 2013-07-15 DIAGNOSIS — J45909 Unspecified asthma, uncomplicated: Secondary | ICD-10-CM | POA: Insufficient documentation

## 2013-07-15 MED ORDER — ALBUTEROL SULFATE HFA 108 (90 BASE) MCG/ACT IN AERS: 2.0000 | INHALATION_SPRAY | Freq: Four times a day (QID) | RESPIRATORY_TRACT | Status: DC | PRN

## 2013-07-15 MED ORDER — VITAMIN D (ERGOCALCIFEROL) 1.25 MG (50000 UNIT) PO CAPS: 50000.0000 [IU] | ORAL_CAPSULE | ORAL | Status: DC

## 2013-07-15 NOTE — Progress Notes (Signed)
MRN: [id number] Name: Tanya Hayes  Sex: female Age: 28 y.o. DOB: [birth year]  Allergies: Review of patient's allergies indicates no known allergies.  Chief Complaint  Patient presents with  . Follow-up     HPI: Patient is 28 y.o. female who comes today for followup history of asthma, she is requesting refill on albuterol, also she had blood work done recently which was reviewed noticed vitamin D deficiency as well as hypertriglyceridemia, she denies any acute symptoms already scheduled to follow with her gynecologist.  Past Medical History  Diagnosis Date  . Asthma   . Obesity   . PCOS (polycystic ovarian syndrome)     History reviewed. No pertinent past surgical history.    Medication List       This list is accurate as of: 07/15/13  3:13 PM.  Always use your most recent med list.               albuterol 108 (90 BASE) MCG/ACT inhaler  Commonly known as:  PROVENTIL HFA;VENTOLIN HFA  Inhale 2 puffs into the lungs every 6 (six) hours as needed. For wheezing.     multivitamin with minerals Tabs tablet  Take 1 tablet by mouth every morning.     oxyCODONE-acetaminophen 7.5-325 MG per tablet  Commonly known as:  PERCOCET  Take 1 tablet by mouth every 4 (four) hours as needed. For pain.     promethazine 25 MG tablet  Commonly known as:  PHENERGAN  Take 1 tablet (25 mg total) by mouth every 6 (six) hours as needed for nausea.     tamsulosin 0.4 MG Caps capsule  Commonly known as:  FLOMAX  Take 1 capsule (0.4 mg total) by mouth daily.     Vitamin D (Ergocalciferol) 50000 UNITS Caps capsule  Commonly known as:  DRISDOL  Take 1 capsule (50,000 Units total) by mouth every 7 (seven) days.        Meds ordered this encounter  Medications  . Vitamin D, Ergocalciferol, (DRISDOL) 50000 UNITS CAPS capsule    Sig: Take 1 capsule (50,000 Units total) by mouth every 7 (seven) days.    Dispense:  12 capsule    Refill:  0  . albuterol (PROVENTIL HFA;VENTOLIN HFA) 108 (90  BASE) MCG/ACT inhaler    Sig: Inhale 2 puffs into the lungs every 6 (six) hours as needed. For wheezing.    Dispense:  18 g    Refill:  2     There is no immunization history on file for this patient.  Family History  Problem Relation Age of Onset  . Hypertension Maternal Grandmother   . Thyroid disease Maternal Grandmother   . Stroke Maternal Grandmother   . Diabetes Father   . Stroke Maternal Grandfather     History  Substance Use Topics  . Smoking status: Never Smoker   . Smokeless tobacco: Never Used  . Alcohol Use: Yes     Comment: occassionally beer once every 2 weeks or wine less than once a month    Review of Systems   As noted in HPI  Filed Vitals:   07/15/13 1453  BP: 133/81  Pulse: 78  Temp: 98.2 F (36.8 C)  Resp: 16    Physical Exam  Physical Exam  Constitutional: No distress.  Eyes: EOM are normal. Pupils are equal, round, and reactive to light.  Cardiovascular: Normal rate and regular rhythm.   Pulmonary/Chest: Breath sounds normal. No respiratory distress. She has no wheezes. She has no rales.  CBC    Component Value Date/Time   WBC 5.9 06/03/2013 1116   RBC 4.72 06/03/2013 1116   HGB 14.4 06/03/2013 1116   HCT 43.0 06/03/2013 1116   PLT 266 06/03/2013 1116   MCV 91.1 06/03/2013 1116   LYMPHSABS 3.0 06/03/2013 1116   MONOABS 0.4 06/03/2013 1116   EOSABS 0.3 06/03/2013 1116   BASOSABS 0.0 06/03/2013 1116    CMP     Component Value Date/Time   NA 138 06/03/2013 1116   K 4.0 06/03/2013 1116   CL 104 06/03/2013 1116   CO2 28 06/03/2013 1116   GLUCOSE 89 06/03/2013 1116   BUN 7 06/03/2013 1116   CREATININE 0.68 06/03/2013 1116   CREATININE 0.80 01/07/2011 0752   CALCIUM 9.3 06/03/2013 1116   PROT 7.0 06/03/2013 1116   ALBUMIN 4.3 06/03/2013 1116   AST 15 06/03/2013 1116   ALT 15 06/03/2013 1116   ALKPHOS 70 06/03/2013 1116   BILITOT 0.3 06/03/2013 1116    Lab Results  Component Value Date/Time   CHOL 175 06/03/2013 11:16 AM    No components  found with this basename: hga1c    Lab Results  Component Value Date/Time   AST 15 06/03/2013 11:16 AM    Assessment and Plan  Unspecified asthma(493.90) - Plan: albuterol (PROVENTIL HFA;VENTOLIN HFA) 108 (90 BASE) MCG/ACT inhaler  Hypertriglyceridemia Advised patient for low fat diet and exercise  Unspecified vitamin D deficiency - Plan: Vitamin D, Ergocalciferol, (DRISDOL) 50000 UNITS CAPS capsule   Return in about 4 months (around 11/14/2013).  Doris Cheadle, MD

## 2013-07-15 NOTE — Progress Notes (Signed)
Patient here for follow up- asthma

## 2013-07-15 NOTE — Patient Instructions (Signed)
Fat and Cholesterol Control Diet  Fat and cholesterol levels in your blood and organs are influenced by your diet. High levels of fat and cholesterol may lead to diseases of the heart, small and large blood vessels, gallbladder, liver, and pancreas.  CONTROLLING FAT AND CHOLESTEROL WITH DIET  Although exercise and lifestyle factors are important, your diet is key. That is because certain foods are known to raise cholesterol and others to lower it. The goal is to balance foods for their effect on cholesterol and more importantly, to replace saturated and trans fat with other types of fat, such as monounsaturated fat, polyunsaturated fat, and omega-3 fatty acids.  On average, a person should consume no more than 15 to 17 g of saturated fat daily. Saturated and trans fats are considered "bad" fats, and they will raise LDL cholesterol. Saturated fats are primarily found in animal products such as meats, butter, and cream. However, that does not mean you need to give up all your favorite foods. Today, there are good tasting, low-fat, low-cholesterol substitutes for most of the things you like to eat. Choose low-fat or nonfat alternatives. Choose round or loin cuts of red meat. These types of cuts are lowest in fat and cholesterol. Chicken (without the skin), fish, veal, and ground turkey breast are great choices. Eliminate fatty meats, such as hot dogs and salami. Even shellfish have little or no saturated fat. Have a 3 oz (85 g) portion when you eat lean meat, poultry, or fish.  Trans fats are also called "partially hydrogenated oils." They are oils that have been scientifically manipulated so that they are solid at room temperature resulting in a longer shelf life and improved taste and texture of foods in which they are added. Trans fats are found in stick margarine, some tub margarines, cookies, crackers, and baked goods.   When baking and cooking, oils are a great substitute for butter. The monounsaturated oils are  especially beneficial since it is believed they lower LDL and raise HDL. The oils you should avoid entirely are saturated tropical oils, such as coconut and palm.   Remember to eat a lot from food groups that are naturally free of saturated and trans fat, including fish, fruit, vegetables, beans, grains (barley, rice, couscous, bulgur wheat), and pasta (without cream sauces).   IDENTIFYING FOODS THAT LOWER FAT AND CHOLESTEROL   Soluble fiber may lower your cholesterol. This type of fiber is found in fruits such as apples, vegetables such as broccoli, potatoes, and carrots, legumes such as beans, peas, and lentils, and grains such as barley. Foods fortified with plant sterols (phytosterol) may also lower cholesterol. You should eat at least 2 g per day of these foods for a cholesterol lowering effect.   Read package labels to identify low-saturated fats, trans fat free, and low-fat foods at the supermarket. Select cheeses that have only 2 to 3 g saturated fat per ounce. Use a heart-healthy tub margarine that is free of trans fats or partially hydrogenated oil. When buying baked goods (cookies, crackers), avoid partially hydrogenated oils. Breads and muffins should be made from whole grains (whole-wheat or whole oat flour, instead of "flour" or "enriched flour"). Buy non-creamy canned soups with reduced salt and no added fats.   FOOD PREPARATION TECHNIQUES   Never deep-fry. If you must fry, either stir-fry, which uses very little fat, or use non-stick cooking sprays. When possible, broil, bake, or roast meats, and steam vegetables. Instead of putting butter or margarine on vegetables, use lemon   and herbs, applesauce, and cinnamon (for squash and sweet potatoes). Use nonfat yogurt, salsa, and low-fat dressings for salads.   LOW-SATURATED FAT / LOW-FAT FOOD SUBSTITUTES  Meats / Saturated Fat (g)  · Avoid: Steak, marbled (3 oz/85 g) / 11 g  · Choose: Steak, lean (3 oz/85 g) / 4 g  · Avoid: Hamburger (3 oz/85 g) / 7  g  · Choose: Hamburger, lean (3 oz/85 g) / 5 g  · Avoid: Ham (3 oz/85 g) / 6 g  · Choose: Ham, lean cut (3 oz/85 g) / 2.4 g  · Avoid: Chicken, with skin, dark meat (3 oz/85 g) / 4 g  · Choose: Chicken, skin removed, dark meat (3 oz/85 g) / 2 g  · Avoid: Chicken, with skin, light meat (3 oz/85 g) / 2.5 g  · Choose: Chicken, skin removed, light meat (3 oz/85 g) / 1 g  Dairy / Saturated Fat (g)  · Avoid: Whole milk (1 cup) / 5 g  · Choose: Low-fat milk, 2% (1 cup) / 3 g  · Choose: Low-fat milk, 1% (1 cup) / 1.5 g  · Choose: Skim milk (1 cup) / 0.3 g  · Avoid: Hard cheese (1 oz/28 g) / 6 g  · Choose: Skim milk cheese (1 oz/28 g) / 2 to 3 g  · Avoid: Cottage cheese, 4% fat (1 cup) / 6.5 g  · Choose: Low-fat cottage cheese, 1% fat (1 cup) / 1.5 g  · Avoid: Ice cream (1 cup) / 9 g  · Choose: Sherbet (1 cup) / 2.5 g  · Choose: Nonfat frozen yogurt (1 cup) / 0.3 g  · Choose: Frozen fruit bar / trace  · Avoid: Whipped cream (1 tbs) / 3.5 g  · Choose: Nondairy whipped topping (1 tbs) / 1 g  Condiments / Saturated Fat (g)  · Avoid: Mayonnaise (1 tbs) / 2 g  · Choose: Low-fat mayonnaise (1 tbs) / 1 g  · Avoid: Butter (1 tbs) / 7 g  · Choose: Extra light margarine (1 tbs) / 1 g  · Avoid: Coconut oil (1 tbs) / 11.8 g  · Choose: Olive oil (1 tbs) / 1.8 g  · Choose: Corn oil (1 tbs) / 1.7 g  · Choose: Safflower oil (1 tbs) / 1.2 g  · Choose: Sunflower oil (1 tbs) / 1.4 g  · Choose: Soybean oil (1 tbs) / 2.4 g  · Choose: Canola oil (1 tbs) / 1 g  Document Released: 04/25/2005 Document Revised: 08/20/2012 Document Reviewed: 10/14/2010  [employer]® Patient Information ©2014 [employer].

## 2013-07-17 ENCOUNTER — Other Ambulatory Visit: Payer: Self-pay | Admitting: Internal Medicine

## 2013-07-17 DIAGNOSIS — J45909 Unspecified asthma, uncomplicated: Secondary | ICD-10-CM

## 2013-07-17 MED ORDER — ALBUTEROL SULFATE HFA 108 (90 BASE) MCG/ACT IN AERS: 2.0000 | INHALATION_SPRAY | Freq: Four times a day (QID) | RESPIRATORY_TRACT | Status: DC | PRN

## 2013-07-29 ENCOUNTER — Ambulatory Visit (INDEPENDENT_AMBULATORY_CARE_PROVIDER_SITE_OTHER): Payer: No Typology Code available for payment source | Admitting: Obstetrics & Gynecology

## 2013-07-29 ENCOUNTER — Encounter: Payer: Self-pay | Admitting: Obstetrics & Gynecology

## 2013-07-29 VITALS — BP 126/80 | HR 86 | Ht 65.0 in | Wt 225.4 lb

## 2013-07-29 DIAGNOSIS — Z124 Encounter for screening for malignant neoplasm of cervix: Secondary | ICD-10-CM

## 2013-07-29 DIAGNOSIS — Z309 Encounter for contraceptive management, unspecified: Secondary | ICD-10-CM

## 2013-07-29 MED ORDER — ETONOGESTREL-ETHINYL ESTRADIOL 0.12-0.015 MG/24HR VA RING: VAGINAL_RING | VAGINAL | Status: DC

## 2013-07-29 NOTE — Progress Notes (Signed)
Patient ID: Tanya Hayes, female   DOB: June 12, 1985, 28 y.o.   MRN: [id number]  Chief Complaint  Patient presents with  . Amenorrhea    HPI Tanya Hayes is a 28 y.o. female.  [id number] Patient's last menstrual period was 07/29/2012. H/O PCOS, previously on cyclic provera, last used 1 year ago, no menses since. On no BCM, did well  With Nuvaring, would use this again HPI  Past Medical History  Diagnosis Date  . Asthma   . Obesity   . PCOS (polycystic ovarian syndrome)     History reviewed. No pertinent past surgical history.  Family History  Problem Relation Age of Onset  . Hypertension Maternal Grandmother   . Thyroid disease Maternal Grandmother   . Stroke Maternal Grandmother   . Diabetes Father   . Stroke Maternal Grandfather     Social History History  Substance Use Topics  . Smoking status: Never Smoker   . Smokeless tobacco: Never Used  . Alcohol Use: Yes     Comment: occassionally beer once every 2 weeks or wine less than once a month    No Known Allergies  Current Outpatient Prescriptions  Medication Sig Dispense Refill  . albuterol (PROVENTIL HFA;VENTOLIN HFA) 108 (90 BASE) MCG/ACT inhaler Inhale 2 puffs into the lungs every 6 (six) hours as needed. For wheezing.  3 Inhaler  3  . Vitamin D, Ergocalciferol, (DRISDOL) 50000 UNITS CAPS capsule Take 1 capsule (50,000 Units total) by mouth every 7 (seven) days.  12 capsule  0  . etonogestrel-ethinyl estradiol (NUVARING) 0.12-0.015 MG/24HR vaginal ring Insert vaginally and leave in place for 3 consecutive weeks, then remove for 1 week.  1 each  12  . Multiple Vitamin (MULITIVITAMIN WITH MINERALS) TABS Take 1 tablet by mouth every morning.       Marland Kitchen. oxyCODONE-acetaminophen (PERCOCET) 7.5-325 MG per tablet Take 1 tablet by mouth every 4 (four) hours as needed. For pain.      . promethazine (PHENERGAN) 25 MG tablet Take 1 tablet (25 mg total) by mouth every 6 (six) hours as needed for nausea.  20 tablet  0  . Tamsulosin  HCl (FLOMAX) 0.4 MG CAPS Take 1 capsule (0.4 mg total) by mouth daily.  10 capsule  0   No current facility-administered medications for this visit.    Review of Systems Review of Systems  Constitutional: Negative.   Genitourinary: Negative for dysuria, vaginal bleeding, vaginal discharge and pelvic pain.    Blood pressure 126/80, pulse 86, height 5\' 5"  (1.651 m), weight 225 lb 6.4 oz (102.241 kg), last menstrual period 07/29/2012.  Physical Exam Physical Exam  Constitutional: She is oriented to person, place, and time. She appears well-developed. No distress.  Pulmonary/Chest: Effort normal. No respiratory distress.  Abdominal: Soft.  Genitourinary: Vagina normal and uterus normal. No vaginal discharge found.  No mass, pap done  Neurological: She is alert and oriented to person, place, and time.  Skin: Skin is warm and dry.  Psychiatric: She has a normal mood and affect. Her behavior is normal.    Data Reviewed *RADIOLOGY REPORT*  Clinical Data: Amenorrhea  TRANSABDOMINAL AND TRANSVAGINAL ULTRASOUND OF PELVIS  Technique: Both transabdominal and transvaginal ultrasound  examinations of the pelvis were performed. Transabdominal technique  was performed for global imaging of the pelvis including uterus,  ovaries, adnexal regions, and pelvic cul-de-sac.  Comparison: None.  It was necessary to proceed with endovaginal exam following the  transabdominal exam to visualize the ovaries and endometrium.  Findings:  Uterus:  Measures 8.1 x 4.1 x 5.0 cm. No myometrial abnormalities.  Endometrium: Normal in thickness measuring a maximum of 3 mm.  Right ovary: Measures 3.5 x 2.5 x 2.8 cm. Multiple small  peripheral follicles are noted.  Left ovary: Measures 3.9 x 2.6 x 2.2 cm. Multiple small peripheral  follicles are noted.  Other findings: No free fluid  IMPRESSION:  1. Normal sonographic appearance of the uterus.  2. Numerous small similar sized peripheral ovarian follicles.   Polycystic ovarian syndrome would be a consideration.  Original Report Authenticated By: P. Loralie Champagne, M.D.   Assessment    Secondary amenorrhea and  Obesity, c/w dx PCOS, needs BCM    Plan    Nuvaring, RTC 3-4 mo        ARNOLD,JAMES 07/29/2013, 3:34 PM

## 2013-07-29 NOTE — Progress Notes (Signed)
Pts last period was March 2014. Had some spotting last week that lasted about 4 hours. Nothing else since then.

## 2013-09-09 ENCOUNTER — Encounter: Payer: Self-pay | Admitting: Internal Medicine

## 2013-09-10 ENCOUNTER — Telehealth: Payer: Self-pay

## 2013-09-10 NOTE — Telephone Encounter (Signed)
Tywan, Can you call this patient and speak with her? Thank you

## 2013-09-12 ENCOUNTER — Telehealth: Payer: Self-pay | Admitting: Internal Medicine

## 2013-09-12 NOTE — Telephone Encounter (Signed)
Pt says she has been experiencing migraines for several weeks and would like to come in for office visit, there are no slots available within 3 weeks and would like to speak to speak to nurse.

## 2013-09-12 NOTE — Telephone Encounter (Signed)
Called patient and left message for return call.

## 2013-09-17 NOTE — Telephone Encounter (Signed)
I spoke to the pt and I had Asher MuirJamie look over her chart. Her migraines is a new c.c. Therefore she needs to see a doctor. I pushed her to get an appointment and had KR got her our first available.

## 2013-09-19 ENCOUNTER — Ambulatory Visit: Payer: No Typology Code available for payment source | Attending: Internal Medicine | Admitting: Internal Medicine

## 2013-09-19 ENCOUNTER — Encounter: Payer: Self-pay | Admitting: Internal Medicine

## 2013-09-19 VITALS — BP 117/75 | HR 78 | Temp 98.7°F | Resp 16 | Wt 221.6 lb

## 2013-09-19 DIAGNOSIS — E669 Obesity, unspecified: Secondary | ICD-10-CM | POA: Insufficient documentation

## 2013-09-19 DIAGNOSIS — G43909 Migraine, unspecified, not intractable, without status migrainosus: Secondary | ICD-10-CM | POA: Insufficient documentation

## 2013-09-19 DIAGNOSIS — J45909 Unspecified asthma, uncomplicated: Secondary | ICD-10-CM | POA: Insufficient documentation

## 2013-09-19 MED ORDER — BUTALBITAL-APAP-CAFFEINE 50-325-40 MG PO TABS: 1.0000 | ORAL_TABLET | Freq: Four times a day (QID) | ORAL | Status: DC | PRN

## 2013-09-19 MED ORDER — IBUPROFEN 600 MG PO TABS: 600.0000 mg | ORAL_TABLET | Freq: Three times a day (TID) | ORAL | Status: DC | PRN

## 2013-09-19 NOTE — Progress Notes (Signed)
Patient complains of having headaches for the past couple of months States the past two weeks the headaches have intensified and does not Get any relief with OTC medications States no problems with her eyes but does wear contacts for her vision

## 2013-09-19 NOTE — Progress Notes (Signed)
MRN: [id number] Name: Tanya Hayes  Sex: female Age: 28 y.o. DOB: [birth year]  Allergies: Review of patient's allergies indicates no known allergies.  Chief Complaint  Patient presents with  . Headache    HPI: Patient is 28 y.o. female who reported to have on and off headache but for the last few weeks it's more worse she described it as right-sided pulsating a time she cannot tolerate bright light and loud noise, she's tried over-the-counter Aleve without much improvement, she denies any family history of migraine headaches denies any numbness weakness chest and shortness of breath.  Past Medical History  Diagnosis Date  . Asthma   . Obesity   . PCOS (polycystic ovarian syndrome)     History reviewed. No pertinent past surgical history.    Medication List       This list is accurate as of: 09/19/13 10:08 AM.  Always use your most recent med list.               albuterol 108 (90 BASE) MCG/ACT inhaler  Commonly known as:  PROVENTIL HFA;VENTOLIN HFA  Inhale 2 puffs into the lungs every 6 (six) hours as needed. For wheezing.     butalbital-acetaminophen-caffeine 50-325-40 MG per tablet  Commonly known as:  ESGIC  Take 1 tablet by mouth every 6 (six) hours as needed for headache.     etonogestrel-ethinyl estradiol 0.12-0.015 MG/24HR vaginal ring  Commonly known as:  NUVARING  Insert vaginally and leave in place for 3 consecutive weeks, then remove for 1 week.     ibuprofen 600 MG tablet  Commonly known as:  ADVIL,MOTRIN  Take 1 tablet (600 mg total) by mouth every 8 (eight) hours as needed.     multivitamin with minerals Tabs tablet  Take 1 tablet by mouth every morning.     oxyCODONE-acetaminophen 7.5-325 MG per tablet  Commonly known as:  PERCOCET  Take 1 tablet by mouth every 4 (four) hours as needed. For pain.     promethazine 25 MG tablet  Commonly known as:  PHENERGAN  Take 1 tablet (25 mg total) by mouth every 6 (six) hours as needed for nausea.     tamsulosin 0.4 MG Caps capsule  Commonly known as:  FLOMAX  Take 1 capsule (0.4 mg total) by mouth daily.     Vitamin D (Ergocalciferol) 50000 UNITS Caps capsule  Commonly known as:  DRISDOL  Take 1 capsule (50,000 Units total) by mouth every 7 (seven) days.        Meds ordered this encounter  Medications  . butalbital-acetaminophen-caffeine (ESGIC) 50-325-40 MG per tablet    Sig: Take 1 tablet by mouth every 6 (six) hours as needed for headache.    Dispense:  30 tablet    Refill:  0  . ibuprofen (ADVIL,MOTRIN) 600 MG tablet    Sig: Take 1 tablet (600 mg total) by mouth every 8 (eight) hours as needed.    Dispense:  30 tablet    Refill:  1     There is no immunization history on file for this patient.  Family History  Problem Relation Age of Onset  . Hypertension Maternal Grandmother   . Thyroid disease Maternal Grandmother   . Stroke Maternal Grandmother   . Diabetes Father   . Stroke Maternal Grandfather     History  Substance Use Topics  . Smoking status: Never Smoker   . Smokeless tobacco: Never Used  . Alcohol Use: Yes     Comment: occassionally  beer once every 2 weeks or wine less than once a month    Review of Systems   As noted in HPI  Filed Vitals:   09/19/13 0956  BP: 117/75  Pulse: 78  Temp: 98.7 F (37.1 C)  Resp: 16    Physical Exam  Physical Exam  Constitutional: No distress.  Eyes: EOM are normal. Pupils are equal, round, and reactive to light.  Cardiovascular: Normal rate and regular rhythm.   Pulmonary/Chest: Breath sounds normal. No respiratory distress. She has no wheezes. She has no rales.  Musculoskeletal: She exhibits no edema.  Neurological: She has normal reflexes.    CBC    Component Value Date/Time   WBC 5.9 06/03/2013 1116   RBC 4.72 06/03/2013 1116   HGB 14.4 06/03/2013 1116   HCT 43.0 06/03/2013 1116   PLT 266 06/03/2013 1116   MCV 91.1 06/03/2013 1116   LYMPHSABS 3.0 06/03/2013 1116   MONOABS 0.4 06/03/2013 1116    EOSABS 0.3 06/03/2013 1116   BASOSABS 0.0 06/03/2013 1116    CMP     Component Value Date/Time   NA 138 06/03/2013 1116   K 4.0 06/03/2013 1116   CL 104 06/03/2013 1116   CO2 28 06/03/2013 1116   GLUCOSE 89 06/03/2013 1116   BUN 7 06/03/2013 1116   CREATININE 0.68 06/03/2013 1116   CREATININE 0.80 01/07/2011 0752   CALCIUM 9.3 06/03/2013 1116   PROT 7.0 06/03/2013 1116   ALBUMIN 4.3 06/03/2013 1116   AST 15 06/03/2013 1116   ALT 15 06/03/2013 1116   ALKPHOS 70 06/03/2013 1116   BILITOT 0.3 06/03/2013 1116   GFRNONAA >89 06/03/2013 1116   GFRAA >89 06/03/2013 1116    Lab Results  Component Value Date/Time   CHOL 175 06/03/2013 11:16 AM    No components found with this basename: hga1c    Lab Results  Component Value Date/Time   AST 15 06/03/2013 11:16 AM    Assessment and Plan  Migraine - Plan: Trial of butalbital-acetaminophen-caffeine (ESGIC) 50-325-40 MG per tablet, ibuprofen (ADVIL,MOTRIN) 600 MG tablet   Return if symptoms worsen or fail to improve.  Doris Cheadleeepak Verina Galeno, MD

## 2013-11-14 ENCOUNTER — Ambulatory Visit: Payer: Self-pay | Attending: Internal Medicine | Admitting: Internal Medicine

## 2013-11-14 ENCOUNTER — Encounter: Payer: Self-pay | Admitting: Internal Medicine

## 2013-11-14 VITALS — BP 127/84 | HR 78 | Temp 99.0°F | Resp 16 | Wt 201.4 lb

## 2013-11-14 DIAGNOSIS — G43909 Migraine, unspecified, not intractable, without status migrainosus: Secondary | ICD-10-CM | POA: Insufficient documentation

## 2013-11-14 DIAGNOSIS — E669 Obesity, unspecified: Secondary | ICD-10-CM

## 2013-11-14 DIAGNOSIS — E559 Vitamin D deficiency, unspecified: Secondary | ICD-10-CM

## 2013-11-14 DIAGNOSIS — E781 Pure hyperglyceridemia: Secondary | ICD-10-CM

## 2013-11-14 DIAGNOSIS — G43009 Migraine without aura, not intractable, without status migrainosus: Secondary | ICD-10-CM

## 2013-11-14 NOTE — Patient Instructions (Signed)
Fat and Cholesterol Control Diet  Fat and cholesterol levels in your blood and organs are influenced by your diet. High levels of fat and cholesterol may lead to diseases of the heart, small and large blood vessels, gallbladder, liver, and pancreas.  CONTROLLING FAT AND CHOLESTEROL WITH DIET  Although exercise and lifestyle factors are important, your diet is key. That is because certain foods are known to raise cholesterol and others to lower it. The goal is to balance foods for their effect on cholesterol and more importantly, to replace saturated and trans fat with other types of fat, such as monounsaturated fat, polyunsaturated fat, and omega-3 fatty acids.  On average, a person should consume no more than 15 to 17 g of saturated fat daily. Saturated and trans fats are considered "bad" fats, and they will raise LDL cholesterol. Saturated fats are primarily found in animal products such as meats, butter, and cream. However, that does not mean you need to give up all your favorite foods. Today, there are good tasting, low-fat, low-cholesterol substitutes for most of the things you like to eat. Choose low-fat or nonfat alternatives. Choose round or loin cuts of red meat. These types of cuts are lowest in fat and cholesterol. Chicken (without the skin), fish, veal, and ground turkey breast are great choices. Eliminate fatty meats, such as hot dogs and salami. Even shellfish have little or no saturated fat. Have a 3 oz (85 g) portion when you eat lean meat, poultry, or fish.  Trans fats are also called "partially hydrogenated oils." They are oils that have been scientifically manipulated so that they are solid at room temperature resulting in a longer shelf life and improved taste and texture of foods in which they are added. Trans fats are found in stick margarine, some tub margarines, cookies, crackers, and baked goods.   When baking and cooking, oils are a great substitute for butter. The monounsaturated oils are  especially beneficial since it is believed they lower LDL and raise HDL. The oils you should avoid entirely are saturated tropical oils, such as coconut and palm.   Remember to eat a lot from food groups that are naturally free of saturated and trans fat, including fish, fruit, vegetables, beans, grains (barley, rice, couscous, bulgur wheat), and pasta (without cream sauces).   IDENTIFYING FOODS THAT LOWER FAT AND CHOLESTEROL   Soluble fiber may lower your cholesterol. This type of fiber is found in fruits such as apples, vegetables such as broccoli, potatoes, and carrots, legumes such as beans, peas, and lentils, and grains such as barley. Foods fortified with plant sterols (phytosterol) may also lower cholesterol. You should eat at least 2 g per day of these foods for a cholesterol lowering effect.   Read package labels to identify low-saturated fats, trans fat free, and low-fat foods at the supermarket. Select cheeses that have only 2 to 3 g saturated fat per ounce. Use a heart-healthy tub margarine that is free of trans fats or partially hydrogenated oil. When buying baked goods (cookies, crackers), avoid partially hydrogenated oils. Breads and muffins should be made from whole grains (whole-wheat or whole oat flour, instead of "flour" or "enriched flour"). Buy non-creamy canned soups with reduced salt and no added fats.   FOOD PREPARATION TECHNIQUES   Never deep-fry. If you must fry, either stir-fry, which uses very little fat, or use non-stick cooking sprays. When possible, broil, bake, or roast meats, and steam vegetables. Instead of putting butter or margarine on vegetables, use lemon   and herbs, applesauce, and cinnamon (for squash and sweet potatoes). Use nonfat yogurt, salsa, and low-fat dressings for salads.   LOW-SATURATED FAT / LOW-FAT FOOD SUBSTITUTES  Meats / Saturated Fat (g)  · Avoid: Steak, marbled (3 oz/85 g) / 11 g  · Choose: Steak, lean (3 oz/85 g) / 4 g  · Avoid: Hamburger (3 oz/85 g) / 7  g  · Choose: Hamburger, lean (3 oz/85 g) / 5 g  · Avoid: Ham (3 oz/85 g) / 6 g  · Choose: Ham, lean cut (3 oz/85 g) / 2.4 g  · Avoid: Chicken, with skin, dark meat (3 oz/85 g) / 4 g  · Choose: Chicken, skin removed, dark meat (3 oz/85 g) / 2 g  · Avoid: Chicken, with skin, light meat (3 oz/85 g) / 2.5 g  · Choose: Chicken, skin removed, light meat (3 oz/85 g) / 1 g  Dairy / Saturated Fat (g)  · Avoid: Whole milk (1 cup) / 5 g  · Choose: Low-fat milk, 2% (1 cup) / 3 g  · Choose: Low-fat milk, 1% (1 cup) / 1.5 g  · Choose: Skim milk (1 cup) / 0.3 g  · Avoid: Hard cheese (1 oz/28 g) / 6 g  · Choose: Skim milk cheese (1 oz/28 g) / 2 to 3 g  · Avoid: Cottage cheese, 4% fat (1 cup) / 6.5 g  · Choose: Low-fat cottage cheese, 1% fat (1 cup) / 1.5 g  · Avoid: Ice cream (1 cup) / 9 g  · Choose: Sherbet (1 cup) / 2.5 g  · Choose: Nonfat frozen yogurt (1 cup) / 0.3 g  · Choose: Frozen fruit bar / trace  · Avoid: Whipped cream (1 tbs) / 3.5 g  · Choose: Nondairy whipped topping (1 tbs) / 1 g  Condiments / Saturated Fat (g)  · Avoid: Mayonnaise (1 tbs) / 2 g  · Choose: Low-fat mayonnaise (1 tbs) / 1 g  · Avoid: Butter (1 tbs) / 7 g  · Choose: Extra light margarine (1 tbs) / 1 g  · Avoid: Coconut oil (1 tbs) / 11.8 g  · Choose: Olive oil (1 tbs) / 1.8 g  · Choose: Corn oil (1 tbs) / 1.7 g  · Choose: Safflower oil (1 tbs) / 1.2 g  · Choose: Sunflower oil (1 tbs) / 1.4 g  · Choose: Soybean oil (1 tbs) / 2.4 g  · Choose: Canola oil (1 tbs) / 1 g  Document Released: 04/25/2005 Document Revised: 08/20/2012 Document Reviewed: 10/14/2010  [employer]® Patient Information ©2015 [employer]. This information is not intended to replace advice given to you by your health care provider. Make sure you discuss any questions you have with your health care provider.

## 2013-11-14 NOTE — Progress Notes (Signed)
Patient here for follow up on her headaches

## 2013-11-14 NOTE — Progress Notes (Signed)
MRN: [id number] Name: Tanya Hayes  Sex: female Age: 28 y.o. DOB: 07-Sep-1985  Allergies: Review of patient's allergies indicates no known allergies.  Chief Complaint  Patient presents with  . Follow-up    HPI: Patient is 28 y.o. female who has history of migraine headaches obesity vitamin D deficiency comes today for followup, currently she denies any headache she takes ibuprofen when necessary, she also history vitamin D deficiency she already finished prescription dose of vitamin D, she's trying to lose weight and has lost 20 pounds since the last visit, she is up-to-date with Pap smear.  Past Medical History  Diagnosis Date  . Asthma   . Obesity   . PCOS (polycystic ovarian syndrome)     History reviewed. No pertinent past surgical history.    Medication List       This list is accurate as of: 11/14/13 10:30 AM.  Always use your most recent med list.               albuterol 108 (90 BASE) MCG/ACT inhaler  Commonly known as:  PROVENTIL HFA;VENTOLIN HFA  Inhale 2 puffs into the lungs every 6 (six) hours as needed. For wheezing.     butalbital-acetaminophen-caffeine 50-325-40 MG per tablet  Commonly known as:  ESGIC  Take 1 tablet by mouth every 6 (six) hours as needed for headache.     etonogestrel-ethinyl estradiol 0.12-0.015 MG/24HR vaginal ring  Commonly known as:  NUVARING  Insert vaginally and leave in place for 3 consecutive weeks, then remove for 1 week.     ibuprofen 600 MG tablet  Commonly known as:  ADVIL,MOTRIN  Take 1 tablet (600 mg total) by mouth every 8 (eight) hours as needed.     multivitamin with minerals Tabs tablet  Take 1 tablet by mouth every morning.     oxyCODONE-acetaminophen 7.5-325 MG per tablet  Commonly known as:  PERCOCET  Take 1 tablet by mouth every 4 (four) hours as needed. For pain.     promethazine 25 MG tablet  Commonly known as:  PHENERGAN  Take 1 tablet (25 mg total) by mouth every 6 (six) hours as needed for nausea.      tamsulosin 0.4 MG Caps capsule  Commonly known as:  FLOMAX  Take 1 capsule (0.4 mg total) by mouth daily.     Vitamin D (Ergocalciferol) 50000 UNITS Caps capsule  Commonly known as:  DRISDOL  Take 1 capsule (50,000 Units total) by mouth every 7 (seven) days.        No orders of the defined types were placed in this encounter.     There is no immunization history on file for this patient.  Family History  Problem Relation Age of Onset  . Hypertension Maternal Grandmother   . Thyroid disease Maternal Grandmother   . Stroke Maternal Grandmother   . Diabetes Father   . Stroke Maternal Grandfather     History  Substance Use Topics  . Smoking status: Never Smoker   . Smokeless tobacco: Never Used  . Alcohol Use: Yes     Comment: occassionally beer once every 2 weeks or wine less than once a month    Review of Systems   As noted in HPI  Filed Vitals:   11/14/13 0957  BP: 127/84  Pulse: 78  Temp: 99 F (37.2 C)  Resp: 16    Physical Exam  Physical Exam  Constitutional:  obese female sitting comfortably not in acute distress  Eyes: EOM are  normal. Pupils are equal, round, and reactive to light.  Cardiovascular: Normal rate and regular rhythm.   Pulmonary/Chest: Breath sounds normal. No respiratory distress. She has no wheezes. She has no rales.    CBC    Component Value Date/Time   WBC 5.9 06/03/2013 1116   RBC 4.72 06/03/2013 1116   HGB 14.4 06/03/2013 1116   HCT 43.0 06/03/2013 1116   PLT 266 06/03/2013 1116   MCV 91.1 06/03/2013 1116   LYMPHSABS 3.0 06/03/2013 1116   MONOABS 0.4 06/03/2013 1116   EOSABS 0.3 06/03/2013 1116   BASOSABS 0.0 06/03/2013 1116    CMP     Component Value Date/Time   NA 138 06/03/2013 1116   K 4.0 06/03/2013 1116   CL 104 06/03/2013 1116   CO2 28 06/03/2013 1116   GLUCOSE 89 06/03/2013 1116   BUN 7 06/03/2013 1116   CREATININE 0.68 06/03/2013 1116   CREATININE 0.80 01/07/2011 0752   CALCIUM 9.3 06/03/2013 1116   PROT 7.0  06/03/2013 1116   ALBUMIN 4.3 06/03/2013 1116   AST 15 06/03/2013 1116   ALT 15 06/03/2013 1116   ALKPHOS 70 06/03/2013 1116   BILITOT 0.3 06/03/2013 1116   GFRNONAA >89 06/03/2013 1116   GFRAA >89 06/03/2013 1116    Lab Results  Component Value Date/Time   CHOL 175 06/03/2013 11:16 AM    No components found with this basename: hga1c    Lab Results  Component Value Date/Time   AST 15 06/03/2013 11:16 AM    Assessment and Plan  Nonintractable migraine, unspecified migraine type Continue with pain medication when necessary.  Unspecified vitamin D deficiency Patient is going to start over-the-counter vitamin D supplement 2000 units daily.  Obesity, unspecified Continue with diet and exercise she has lost 20 pounds.  Hypertriglyceridemia Patient is advised for low-fat diet. We'll recheck lipid panel on the next visit.    Return in about 6 months (around 05/17/2014), or if symptoms worsen or fail to improve, for migraine, hyperipidemia.  Doris Cheadle, MD

## 2014-01-28 ENCOUNTER — Ambulatory Visit: Payer: Self-pay | Attending: Internal Medicine

## 2014-03-10 ENCOUNTER — Ambulatory Visit (HOSPITAL_BASED_OUTPATIENT_CLINIC_OR_DEPARTMENT_OTHER): Payer: Self-pay

## 2014-03-10 ENCOUNTER — Encounter: Payer: Self-pay | Admitting: Internal Medicine

## 2014-03-10 ENCOUNTER — Ambulatory Visit: Payer: Self-pay | Attending: Internal Medicine | Admitting: Internal Medicine

## 2014-03-10 VITALS — BP 130/83 | HR 80 | Temp 98.0°F | Resp 16 | Wt 190.6 lb

## 2014-03-10 DIAGNOSIS — J45909 Unspecified asthma, uncomplicated: Secondary | ICD-10-CM | POA: Insufficient documentation

## 2014-03-10 DIAGNOSIS — G43009 Migraine without aura, not intractable, without status migrainosus: Secondary | ICD-10-CM

## 2014-03-10 DIAGNOSIS — E282 Polycystic ovarian syndrome: Secondary | ICD-10-CM | POA: Insufficient documentation

## 2014-03-10 DIAGNOSIS — E781 Pure hyperglyceridemia: Secondary | ICD-10-CM | POA: Insufficient documentation

## 2014-03-10 DIAGNOSIS — Z23 Encounter for immunization: Secondary | ICD-10-CM

## 2014-03-10 DIAGNOSIS — E669 Obesity, unspecified: Secondary | ICD-10-CM | POA: Insufficient documentation

## 2014-03-10 DIAGNOSIS — G43909 Migraine, unspecified, not intractable, without status migrainosus: Secondary | ICD-10-CM | POA: Insufficient documentation

## 2014-03-10 LAB — LIPID PANEL
Cholesterol: 168 mg/dL (ref 0–200)
HDL: 58 mg/dL (ref >39)
LDL CALC: 85 mg/dL (ref 0–99)
Total CHOL/HDL Ratio: 2.9 Ratio
Triglycerides: 125 mg/dL (ref <150)
VLDL: 25 mg/dL (ref 0–40)

## 2014-03-10 NOTE — Progress Notes (Signed)
MRN: [id number] Name: Tanya Hayes  Sex: female Age: 28 y.o. DOB: Sep 22, 1985  Allergies: Review of patient's allergies indicates no known allergies.  Chief Complaint  Patient presents with  . Follow-up    HPI: Patient is 28 y.o. female who history of migraine headaches, obesity, hypertriglyceridemia comes today for followup, she takes ibuprofen when necessary currently denies any headache dizziness chest and shortness of breath, patient denies smoking cigarettes, patient will like to get a flu shot today.  Past Medical History  Diagnosis Date  . Asthma   . Obesity   . PCOS (polycystic ovarian syndrome)     History reviewed. No pertinent past surgical history.    Medication List       This list is accurate as of: 03/10/14  9:29 AM.  Always use your most recent med list.               albuterol 108 (90 BASE) MCG/ACT inhaler  Commonly known as:  PROVENTIL HFA;VENTOLIN HFA  Inhale 2 puffs into the lungs every 6 (six) hours as needed. For wheezing.     butalbital-acetaminophen-caffeine 50-325-40 MG per tablet  Commonly known as:  ESGIC  Take 1 tablet by mouth every 6 (six) hours as needed for headache.     etonogestrel-ethinyl estradiol 0.12-0.015 MG/24HR vaginal ring  Commonly known as:  NUVARING  Insert vaginally and leave in place for 3 consecutive weeks, then remove for 1 week.     ibuprofen 600 MG tablet  Commonly known as:  ADVIL,MOTRIN  Take 1 tablet (600 mg total) by mouth every 8 (eight) hours as needed.     multivitamin with minerals Tabs tablet  Take 1 tablet by mouth every morning.     oxyCODONE-acetaminophen 7.5-325 MG per tablet  Commonly known as:  PERCOCET  Take 1 tablet by mouth every 4 (four) hours as needed. For pain.     promethazine 25 MG tablet  Commonly known as:  PHENERGAN  Take 1 tablet (25 mg total) by mouth every 6 (six) hours as needed for nausea.     tamsulosin 0.4 MG Caps capsule  Commonly known as:  FLOMAX  Take 1 capsule  (0.4 mg total) by mouth daily.     Vitamin D (Ergocalciferol) 50000 UNITS Caps capsule  Commonly known as:  DRISDOL  Take 1 capsule (50,000 Units total) by mouth every 7 (seven) days.        No orders of the defined types were placed in this encounter.    Immunization History  Administered Date(s) Administered  . Influenza,inj,Quad PF,36+ Mos 03/10/2014    Family History  Problem Relation Age of Onset  . Hypertension Maternal Grandmother   . Thyroid disease Maternal Grandmother   . Stroke Maternal Grandmother   . Diabetes Father   . Stroke Maternal Grandfather     History  Substance Use Topics  . Smoking status: Never Smoker   . Smokeless tobacco: Never Used  . Alcohol Use: Yes     Comment: occassionally beer once every 2 weeks or wine less than once a month    Review of Systems   As noted in HPI  Filed Vitals:   03/10/14 0914  BP: 130/83  Pulse: 80  Temp: 98 F (36.7 C)  Resp: 16    Physical Exam  Physical Exam  Constitutional: No distress.  Eyes: EOM are normal. Pupils are equal, round, and reactive to light.  Cardiovascular: Normal rate and regular rhythm.   Pulmonary/Chest: Breath sounds normal.  No respiratory distress. She has no wheezes. She has no rales.    CBC    Component Value Date/Time   WBC 5.9 06/03/2013 1116   RBC 4.72 06/03/2013 1116   HGB 14.4 06/03/2013 1116   HCT 43.0 06/03/2013 1116   PLT 266 06/03/2013 1116   MCV 91.1 06/03/2013 1116   LYMPHSABS 3.0 06/03/2013 1116   MONOABS 0.4 06/03/2013 1116   EOSABS 0.3 06/03/2013 1116   BASOSABS 0.0 06/03/2013 1116    CMP     Component Value Date/Time   NA 138 06/03/2013 1116   K 4.0 06/03/2013 1116   CL 104 06/03/2013 1116   CO2 28 06/03/2013 1116   GLUCOSE 89 06/03/2013 1116   BUN 7 06/03/2013 1116   CREATININE 0.68 06/03/2013 1116   CREATININE 0.80 01/07/2011 0752   CALCIUM 9.3 06/03/2013 1116   PROT 7.0 06/03/2013 1116   ALBUMIN 4.3 06/03/2013 1116   AST 15 06/03/2013  1116   ALT 15 06/03/2013 1116   ALKPHOS 70 06/03/2013 1116   BILITOT 0.3 06/03/2013 1116   GFRNONAA >89 06/03/2013 1116   GFRAA >89 06/03/2013 1116    Lab Results  Component Value Date/Time   CHOL 175 06/03/2013 11:16 AM    No components found for: HGA1C  Lab Results  Component Value Date/Time   AST 15 06/03/2013 11:16 AM    Assessment and Plan  Nonintractable migraine, unspecified migraine type Symptoms are controlled, continue with  ibuprofen when necessary.  Hypertriglyceridemia - Plan: advised for low-fat diet, repeat her Lipid panel  Needs flu shot   Health Maintenance  -Pap Smear:up-to-date -Mammogram: -Vaccinations:  Flu shot given today  Return in about 6 months (around 09/08/2014) for hyperipidemia.  Doris CheadleADVANI, Kinsler Soeder, MD

## 2014-03-10 NOTE — Progress Notes (Signed)
Patient states here for her routine follow up on her migraine headaches Patient has no complaints today

## 2014-03-12 ENCOUNTER — Telehealth: Payer: Self-pay | Admitting: Emergency Medicine

## 2014-03-12 NOTE — Telephone Encounter (Signed)
Left message for pt to call for lab results 

## 2014-04-11 ENCOUNTER — Ambulatory Visit: Payer: Self-pay | Admitting: Obstetrics & Gynecology

## 2014-05-21 ENCOUNTER — Other Ambulatory Visit: Payer: Self-pay | Admitting: Internal Medicine

## 2014-05-26 ENCOUNTER — Other Ambulatory Visit: Payer: Self-pay | Admitting: Internal Medicine

## 2014-06-12 ENCOUNTER — Encounter: Payer: Self-pay | Admitting: Obstetrics & Gynecology

## 2014-06-12 ENCOUNTER — Ambulatory Visit (INDEPENDENT_AMBULATORY_CARE_PROVIDER_SITE_OTHER): Payer: Self-pay | Admitting: Obstetrics & Gynecology

## 2014-06-12 VITALS — BP 121/82 | HR 101 | Ht 65.5 in | Wt 179.5 lb

## 2014-06-12 DIAGNOSIS — Z3009 Encounter for other general counseling and advice on contraception: Secondary | ICD-10-CM

## 2014-06-12 DIAGNOSIS — Z113 Encounter for screening for infections with a predominantly sexual mode of transmission: Secondary | ICD-10-CM

## 2014-06-12 DIAGNOSIS — Z3041 Encounter for surveillance of contraceptive pills: Secondary | ICD-10-CM

## 2014-06-12 MED ORDER — NORGESTIMATE-ETH ESTRADIOL 0.25-35 MG-MCG PO TABS
1.0000 | ORAL_TABLET | Freq: Every day | ORAL | Status: DC
Start: 2014-06-12 — End: 2017-09-14

## 2014-06-12 NOTE — Progress Notes (Signed)
Subjective:     Patient ID: Burman FosterLatoya Mendoza, female   DOB: Jan 08, 1986, 29 y.o.   MRN: [id number]  HPI Pt wants to change from Nuvaring to OCP's.  She reports that her spouse cheated and she wants a full STI screen. She is not having sx.     Review of Systems     Objective:   Physical Exam BP 121/82 mmHg  Pulse 101  Ht 5' 5.5" (1.664 m)  Wt 179 lb 8 oz (81.421 kg)  BMI 29.41 kg/m2  LMP 06/12/2014 (Exact Date)  Exam deferred     Assessment:     Contraception counseling- no contraindications to OCP's  STI screening    Plan:     Sprintec 1 po q day Labs: HIV, RPR and cx

## 2014-06-12 NOTE — Progress Notes (Signed)
Currently on nuvaring would like to switch to pills, also requesting std testing spouse recently cheated on her.

## 2014-06-13 LAB — HIV ANTIBODY (ROUTINE TESTING W REFLEX): HIV 1&2 Ab, 4th Generation: NONREACTIVE

## 2014-06-13 LAB — GC/CHLAMYDIA PROBE AMP
CT Probe RNA: NEGATIVE
GC Probe RNA: NEGATIVE

## 2014-06-13 LAB — RPR

## 2014-06-18 ENCOUNTER — Telehealth: Payer: Self-pay | Admitting: General Practice

## 2014-06-18 NOTE — Telephone Encounter (Signed)
Patient called and left message stating she would like her results. Called patient back and informed her of negative results. Patient verbalized understanding and asked if she could get a print out of those results. Told patient she could, she would just need to come by the office to get those because she would need to sign a paper first. Patient verbalized understanding and had no other questions

## 2014-06-19 ENCOUNTER — Encounter: Payer: Self-pay | Admitting: Obstetrics & Gynecology

## 2014-07-30 ENCOUNTER — Ambulatory Visit (INDEPENDENT_AMBULATORY_CARE_PROVIDER_SITE_OTHER): Payer: Self-pay

## 2014-07-30 DIAGNOSIS — R300 Dysuria: Secondary | ICD-10-CM

## 2014-07-30 LAB — POCT URINALYSIS DIP (DEVICE)
Bilirubin Urine: NEGATIVE
Glucose, UA: NEGATIVE mg/dL
Ketones, ur: NEGATIVE mg/dL
Nitrite: POSITIVE — AB
PH: 5.5 (ref 5.0–8.0)
PROTEIN: 100 mg/dL — AB
Specific Gravity, Urine: 1.025 (ref 1.005–1.030)
UROBILINOGEN UA: 0.2 mg/dL (ref 0.0–1.0)

## 2014-07-30 NOTE — Progress Notes (Signed)
Patient came into give urine sample today d/t burning with urination. Urinalysis run-- +small hgb, +nitrates, small leuks. Urine sent for culture. Patient informed she will be called once results are reviewed.

## 2014-08-01 LAB — URINE CULTURE: Colony Count: >=100000

## 2014-08-05 ENCOUNTER — Telehealth: Payer: Self-pay

## 2014-08-05 DIAGNOSIS — N390 Urinary tract infection, site not specified: Secondary | ICD-10-CM

## 2014-08-05 MED ORDER — CIPROFLOXACIN HCL 500 MG PO TABS: 500.0000 mg | ORAL_TABLET | Freq: Two times a day (BID) | ORAL | Status: DC

## 2014-08-05 NOTE — Telephone Encounter (Signed)
Patient called for urine culture results. Per chart review, ecoli >100,000 colonies. Dorathy KinsmanVirginia Smith, CNM reviewed chart and prescribed Cipro 500mg  BID X 3 days. Medication e-prescribed. Called patient and informed her of results and RX> patient verbalized understanding and gratitude. No further questions or concerns.

## 2014-09-08 ENCOUNTER — Ambulatory Visit: Payer: Self-pay | Attending: Internal Medicine

## 2014-09-16 ENCOUNTER — Telehealth: Payer: Self-pay | Admitting: Nurse Practitioner

## 2014-09-16 DIAGNOSIS — R059 Cough, unspecified: Secondary | ICD-10-CM

## 2014-09-16 DIAGNOSIS — R05 Cough: Secondary | ICD-10-CM

## 2014-09-16 MED ORDER — BENZONATATE 100 MG PO CAPS: 100.0000 mg | ORAL_CAPSULE | Freq: Three times a day (TID) | ORAL | Status: DC | PRN

## 2014-09-16 NOTE — Progress Notes (Signed)
We are sorry that you are not feeling well.  Here is how we plan to help!  Based on what you have shared with me it looks like you have upper respiratory tract inflammation that has resulted in a significant cough.  Inflammation and infection in the upper respiratory tract is commonly called bronchitis and has four common causes:  Allergies, Viral Infections, Acid Reflux and Bacterial Infections.  Allergies, viruses and acid reflux are treated by controlling symptoms or eliminating the cause. An example might be a cough caused by taking certain blood pressure medications. You stop the cough by changing the medication. Another example might be a cough caused by acid reflux. Controlling the reflux helps control the cough.  Based on your presentation I believe you most likely have A cough due to a virus.  This is called viral bronchitis and is best treated by rest, plenty of fluids and control of the cough.  You may use Ibuprofen or Tylenol as directed to help your symptoms.    In addition you may use A prescription cough medication called Tessalon Perles 100mg. You may take 1-2 capsules every 8 hours as needed for your cough.    HOME CARE . Only take medications as instructed by your medical team. . Complete the entire course of an antibiotic. . Drink plenty of fluids and get plenty of rest. . Avoid close contacts especially the very young and the elderly . Cover your mouth if you cough or cough into your sleeve. . Always remember to wash your hands . A steam or ultrasonic humidifier can help congestion.    GET HELP RIGHT AWAY IF: . You develop worsening fever. . You become short of breath . You cough up blood. . Your symptoms persist after you have completed your treatment plan MAKE SURE YOU   Understand these instructions.  Will watch your condition.  Will get help right away if you are not doing well or get worse.  Your e-visit answers were reviewed by a board certified advanced  clinical practitioner to complete your personal care plan.  Depending on the condition, your plan could have included both over the counter or prescription medications.  If there is a problem please reply  once you have received a response from your provider.  Your safety is important to us.  If you have drug allergies check your prescription carefully.    You can use MyChart to ask questions about today's visit, request a non-urgent call back, or ask for a work or school excuse.  You will get an e-mail in the next two days asking about your experience.  I hope that your e-visit has been valuable and will speed your recovery. Thank you for using e-visits.   

## 2014-11-05 ENCOUNTER — Ambulatory Visit (INDEPENDENT_AMBULATORY_CARE_PROVIDER_SITE_OTHER): Payer: Self-pay | Admitting: Obstetrics & Gynecology

## 2014-11-05 ENCOUNTER — Encounter: Payer: Self-pay | Admitting: Obstetrics & Gynecology

## 2014-11-05 VITALS — BP 128/82 | HR 75 | Temp 98.6°F | Ht 65.0 in | Wt 199.3 lb

## 2014-11-05 DIAGNOSIS — R21 Rash and other nonspecific skin eruption: Secondary | ICD-10-CM

## 2014-11-05 NOTE — Patient Instructions (Signed)

## 2014-11-05 NOTE — Progress Notes (Signed)
Patient ID: Tanya Hayes, female   DOB: May 23, 1985, 29 y.o.   MRN: [id number]  Chief Complaint  Patient presents with  . rash   Cc: rash on mons pubis, improved HPI Tanya Hayes is a 29 y.o. female.  [id number] 1-2 weeks ago rash on upper vulvar region which is improving. No d/c or bleeding or odor and she waxes her pubic area Q4 weeks, last done 3+ weeks ago, No blisters  HPI  Past Medical History  Diagnosis Date  . Asthma   . Obesity   . PCOS (polycystic ovarian syndrome)     History reviewed. No pertinent past surgical history.  Family History  Problem Relation Age of Onset  . Hypertension Maternal Grandmother   . Thyroid disease Maternal Grandmother   . Stroke Maternal Grandmother   . Diabetes Father   . Stroke Maternal Grandfather     Social History History  Substance Use Topics  . Smoking status: Never Smoker   . Smokeless tobacco: Never Used  . Alcohol Use: Yes     Comment: occassionally beer once every 2 weeks or wine less than once a month    No Known Allergies  Current Outpatient Prescriptions  Medication Sig Dispense Refill  . albuterol (PROVENTIL HFA;VENTOLIN HFA) 108 (90 BASE) MCG/ACT inhaler Inhale 2 puffs into the lungs every 6 (six) hours as needed. For wheezing. 3 Inhaler 3  . ibuprofen (ADVIL,MOTRIN) 600 MG tablet Take 1 tablet (600 mg total) by mouth every 8 (eight) hours as needed. 30 tablet 1  . Multiple Vitamin (MULITIVITAMIN WITH MINERALS) TABS Take 1 tablet by mouth every morning.     . norgestimate-ethinyl estradiol (ORTHO-CYCLEN,SPRINTEC,PREVIFEM) 0.25-35 MG-MCG tablet Take 1 tablet by mouth daily. 1 Package 11   No current facility-administered medications for this visit.    Review of Systems Review of Systems  Constitutional: Negative.   Gastrointestinal: Negative.   Genitourinary: Negative for vaginal discharge and menstrual problem.       Follicle bumps mons  Skin:       Vulvar rash  Psychiatric/Behavioral: Negative.     Blood  pressure 128/82, pulse 75, temperature 98.6 F (37 C), height 5\' 5"  (1.651 m), weight 199 lb 4.8 oz (90.402 kg), last menstrual period 09/30/2014.  Physical Exam Physical Exam  Constitutional: She is oriented to person, place, and time. She appears well-developed. No distress.  Pulmonary/Chest: Effort normal. No respiratory distress.  Genitourinary: Vaginal discharge (thin grey, wet prep done) found.  Not tender. Few small follicle cysts upper vulva  Neurological: She is alert and oriented to person, place, and time.  Skin: Skin is warm and dry.  Psychiatric: She has a normal mood and affect. Her behavior is normal.    Data Reviewed Medications and problems, lab result of STD tests  Assessment    Suspect recent folliculitis resolving , screening for vaginitis    PCOs on OCP to continue  Plan    F/u wet prep result report if sx don't resolve, continue OCP        Loreta Blouch 11/05/2014, 4:01 PM

## 2014-11-06 LAB — WET PREP, GENITAL
Trich, Wet Prep: NONE SEEN
Yeast Wet Prep HPF POC: NONE SEEN

## 2014-11-13 ENCOUNTER — Telehealth: Payer: Self-pay | Admitting: *Deleted

## 2014-11-13 DIAGNOSIS — B9689 Other specified bacterial agents as the cause of diseases classified elsewhere: Secondary | ICD-10-CM

## 2014-11-13 DIAGNOSIS — N760 Acute vaginitis: Principal | ICD-10-CM

## 2014-11-13 MED ORDER — METRONIDAZOLE 500 MG PO TABS: 500.0000 mg | ORAL_TABLET | Freq: Two times a day (BID) | ORAL | Status: DC

## 2014-11-13 NOTE — Telephone Encounter (Signed)
Contacted patient, results given with information concerning treatment.  Pt verbalizes understanding.  Medication ordered.

## 2014-11-13 NOTE — Telephone Encounter (Signed)
-----   Message from Adam PhenixJames G Arnold, MD sent at 11/11/2014  9:34 AM EDT ----- BV, Flagyl 500 mg BID 7 days

## 2014-12-04 ENCOUNTER — Telehealth: Payer: Self-pay | Admitting: Physician Assistant

## 2014-12-04 DIAGNOSIS — J4520 Mild intermittent asthma, uncomplicated: Secondary | ICD-10-CM

## 2014-12-04 MED ORDER — ALBUTEROL SULFATE HFA 108 (90 BASE) MCG/ACT IN AERS: 2.0000 | INHALATION_SPRAY | Freq: Four times a day (QID) | RESPIRATORY_TRACT | Status: DC | PRN

## 2014-12-04 NOTE — Progress Notes (Signed)
We are sorry that you are not feeling well.  Here is how we plan to help!  Based on what you have shared with me it looks like you have upper respiratory tract inflammation that has resulted in a significant cough.  Inflammation and infection in the upper respiratory tract is commonly called bronchitis and has four common causes:  Allergies, Viral Infections, Acid Reflux and Bacterial Infections.  Allergies, viruses and acid reflux are treated by controlling symptoms or eliminating the cause. An example might be a cough caused by taking certain blood pressure medications. You stop the cough by changing the medication. Another example might be a cough caused by acid reflux. Controlling the reflux helps control the cough.  Based on your presentation I believe you most likely have a cough due to exercise-induced asthma. I have refilled your inhaler.  If symptoms do not improve, please let your primary care provider know. Also you need to follow-up with them regarding ongoing management of asthma symptoms.    HOME CARE . Only take medications as instructed by your medical team. . Complete the entire course of an antibiotic. . Drink plenty of fluids and get plenty of rest. . Avoid close contacts especially the very young and the elderly . Cover your mouth if you cough or cough into your sleeve. . Always remember to wash your hands . A steam or ultrasonic humidifier can help congestion.    GET HELP RIGHT AWAY IF: . You develop worsening fever. . You become short of breath . You cough up blood. . Your symptoms persist after you have completed your treatment plan MAKE SURE YOU   Understand these instructions.  Will watch your condition.  Will get help right away if you are not doing well or get worse.  Your e-visit answers were reviewed by a board certified advanced clinical practitioner to complete your personal care plan.  Depending on the condition, your plan could have included both over the  counter or prescription medications.  If there is a problem please reply  once you have received a response from your provider.  Your safety is important to [place].  If you have drug allergies check your prescription carefully.    You can use MyChart to ask questions about today's visit, request a non-urgent call back, or ask for a work or school excuse.  You will get an e-mail in the next two days asking about your experience.  I hope that your e-visit has been valuable and will speed your recovery. Thank you for using e-visits.

## 2014-12-11 ENCOUNTER — Inpatient Hospital Stay (HOSPITAL_COMMUNITY)
Admission: AD | Admit: 2014-12-11 | Discharge: 2014-12-11 | Disposition: A | Discharge status: Home / Self Care | Payer: Self-pay | Source: Ambulatory Visit | Attending: Obstetrics & Gynecology | Admitting: Obstetrics & Gynecology

## 2014-12-11 ENCOUNTER — Encounter (HOSPITAL_COMMUNITY): Payer: Self-pay | Admitting: *Deleted

## 2014-12-11 DIAGNOSIS — R102 Pelvic and perineal pain: Secondary | ICD-10-CM | POA: Insufficient documentation

## 2014-12-11 DIAGNOSIS — Z87442 Personal history of urinary calculi: Secondary | ICD-10-CM | POA: Insufficient documentation

## 2014-12-11 DIAGNOSIS — R319 Hematuria, unspecified: Secondary | ICD-10-CM | POA: Insufficient documentation

## 2014-12-11 LAB — URINALYSIS, ROUTINE W REFLEX MICROSCOPIC
BILIRUBIN URINE: NEGATIVE
Glucose, UA: NEGATIVE mg/dL
Ketones, ur: NEGATIVE mg/dL
Leukocytes, UA: NEGATIVE
Nitrite: NEGATIVE
PROTEIN: NEGATIVE mg/dL
UROBILINOGEN UA: 0.2 mg/dL (ref 0.0–1.0)
pH: 5.5 (ref 5.0–8.0)

## 2014-12-11 LAB — CBC
HCT: 36.8 % (ref 36.0–46.0)
Hemoglobin: 12.2 g/dL (ref 12.0–15.0)
MCH: 30.9 pg (ref 26.0–34.0)
MCHC: 33.2 g/dL (ref 30.0–36.0)
MCV: 93.2 fL (ref 78.0–100.0)
PLATELETS: 256 10*3/uL (ref 150–400)
RBC: 3.95 MIL/uL (ref 3.87–5.11)
RDW: 12.6 % (ref 11.5–15.5)
WBC: 6.5 10*3/uL (ref 4.0–10.5)

## 2014-12-11 LAB — WET PREP, GENITAL
CLUE CELLS WET PREP: NONE SEEN
Trich, Wet Prep: NONE SEEN
YEAST WET PREP: NONE SEEN

## 2014-12-11 LAB — POCT PREGNANCY, URINE: PREG TEST UR: NEGATIVE

## 2014-12-11 LAB — URINE MICROSCOPIC-ADD ON

## 2014-12-11 MED ORDER — TRAMADOL HCL 50 MG PO TABS: 50.0000 mg | ORAL_TABLET | Freq: Four times a day (QID) | ORAL | Status: DC | PRN

## 2014-12-11 NOTE — MAU Note (Signed)
Pt presents complaining of pain from the middle of her abdomen that radiates to her back and radiating through her vagina that started on Sunday. Denies urinary symptoms. Denies vaginal bleeding or abnormal discharge. Denies pregnancy.

## 2014-12-11 NOTE — MAU Provider Note (Signed)
Chief Complaint: Vaginal Pain; Back Pain; and Abdominal Pain   First Provider Initiated Contact with Patient 12/11/14 0920     SUBJECTIVE HPI: Tanya Hayes is a 29 y.o. G2P2002 female who presents to [facility] reporting colicky low-mid abd pain wrapping around to right low back and sharp pain shooting through her vagina since 12/07/14. Rates pain 5/10. Improves w/ Ibuprofen. No associated Sx. Hx kidney stones. Pain feels similar, but much milder. Has Dx of PCOS, but has not actually had any cysts. Sexually active. Takes OCPs on schedule. She states she is in a mutually monogamous relationship. Patient's last menstrual period was 12/06/2014.--Normal   Past Medical History  Diagnosis Date  . Asthma   . Obesity   . PCOS (polycystic ovarian syndrome)    OB History  Gravida Para Term Preterm AB SAB TAB Ectopic Multiple Living  2 2 2       2     # Outcome Date GA Lbr Len/2nd Weight Sex Delivery Anes PTL Lv  2 Term 05/06/04     Vag-Spont     1 Term 07/28/01     Vag-Spont        History reviewed. No pertinent past surgical history. History   Social History  . Marital Status: Married    Spouse Name: N/A  . Number of Children: N/A  . Years of Education: N/A   Occupational History  . Not on file.   Social History Main Topics  . Smoking status: Never Smoker   . Smokeless tobacco: Never Used  . Alcohol Use: Yes     Comment: occassionally beer once every 2 weeks or wine less than once a month  . Drug Use: No  . Sexual Activity: Yes    Birth Control/ Protection: Pill   Other Topics Concern  . Not on file   Social History Narrative   No current facility-administered medications on file prior to encounter.   Current Outpatient Prescriptions on File Prior to Encounter  Medication Sig Dispense Refill  . albuterol (PROVENTIL HFA;VENTOLIN HFA) 108 (90 BASE) MCG/ACT inhaler Inhale 2 puffs into the lungs every 6 (six) hours as needed. For wheezing. 1 Inhaler 0  . ibuprofen  (ADVIL,MOTRIN) 600 MG tablet Take 1 tablet (600 mg total) by mouth every 8 (eight) hours as needed. 30 tablet 1  . metroNIDAZOLE (FLAGYL) 500 MG tablet Take 1 tablet (500 mg total) by mouth 2 (two) times daily. 14 tablet 0  . Multiple Vitamin (MULITIVITAMIN WITH MINERALS) TABS Take 1 tablet by mouth every morning.     . norgestimate-ethinyl estradiol (ORTHO-CYCLEN,SPRINTEC,PREVIFEM) 0.25-35 MG-MCG tablet Take 1 tablet by mouth daily. 1 Package 11   No Known Allergies  Review of Systems  Constitutional: Negative for fever and chills.  Gastrointestinal: Positive for nausea and abdominal pain. Negative for vomiting, diarrhea, constipation and blood in stool.  Genitourinary: Positive for flank pain (lower than expected). Negative for dysuria, urgency, frequency and hematuria.       Neg for VB, vaginal discharge, dyspareunia.  Musculoskeletal: Negative for myalgias and back pain.    OBJECTIVE Blood pressure 129/85, pulse 76, temperature 98.4 F (36.9 C), temperature source Oral, resp. rate 18, height 5\' 5"  (1.651 m), weight 200 lb 6.4 oz (90.901 kg), last menstrual period 12/06/2014. CONSTITUTIONAL: Well-developed, well-nourished female in no acute distress.  CARDIOVASCULAR: normal rate RESPIRATORY: normal effort GI: Abdomen soft, non-tender. Positive bowel sounds 4. MS: Nontender, no edema NEURO: Alert and oriented GU: No CVAT  SPECULUM EXAM: NEFG, physiologic discharge, no  blood noted, cervix clean  BIMANUAL: cervix closed; uterus normal size, no adnexal tenderness or masses. No CMT.   LAB RESULTS Results for orders placed or performed during the hospital encounter of 12/11/14 (from the past 24 hour(s))  Pregnancy, urine POC     Status: None   Collection Time: 12/11/14  8:58 AM  Result Value Ref Range   Preg Test, Ur NEGATIVE NEGATIVE   Results for orders placed or performed during the hospital encounter of 12/11/14 (from the past 168 hour(s))  GC/Chlamydia probe amp ([facility])not at [facility]   Collection Time: 12/11/14 12:00 AM  Result Value Ref Range   Chlamydia Negative    Neisseria gonorrhea Negative   Urinalysis, Routine w reflex microscopic (not at [facility])   Collection Time: 12/11/14  8:50 AM  Result Value Ref Range   Color, Urine YELLOW YELLOW   APPearance CLEAR CLEAR   Specific Gravity, Urine >1.030 (H) 1.005 - 1.030   pH 5.5 5.0 - 8.0   Glucose, UA NEGATIVE NEGATIVE mg/dL   Hgb urine dipstick TRACE (A) NEGATIVE   Bilirubin Urine NEGATIVE NEGATIVE   Ketones, ur NEGATIVE NEGATIVE mg/dL   Protein, ur NEGATIVE NEGATIVE mg/dL   Urobilinogen, UA 0.2 0.0 - 1.0 mg/dL   Nitrite NEGATIVE NEGATIVE   Leukocytes, UA NEGATIVE NEGATIVE  Urine microscopic-add on   Collection Time: 12/11/14  8:50 AM  Result Value Ref Range   Squamous Epithelial / LPF RARE RARE   WBC, UA 0-2 <3 WBC/hpf  Pregnancy, urine POC   Collection Time: 12/11/14  8:58 AM  Result Value Ref Range   Preg Test, Ur NEGATIVE NEGATIVE  Wet prep, genital   Collection Time: 12/11/14  9:30 AM  Result Value Ref Range   Yeast Wet Prep HPF POC NONE SEEN NONE SEEN   Trich, Wet Prep NONE SEEN NONE SEEN   Clue Cells Wet Prep HPF POC NONE SEEN NONE SEEN   WBC, Wet Prep HPF POC FEW (A) NONE SEEN  CBC   Collection Time: 12/11/14  9:51 AM  Result Value Ref Range   WBC 6.5 4.0 - 10.5 K/uL   RBC 3.95 3.87 - 5.11 MIL/uL   Hemoglobin 12.2 12.0 - 15.0 g/dL   HCT 29.5 62.1 - 30.8 %   MCV 93.2 78.0 - 100.0 fL   MCH 30.9 26.0 - 34.0 pg   MCHC 33.2 30.0 - 36.0 g/dL   RDW 65.7 84.6 - 96.2 %   Platelets 256 150 - 400 K/uL     IMAGING No results found.  MAU COURSE Wet prep, GC/Chlamydia, UA, UPT, CBC. Ultram given.  Pain resolved.   MDM Abd/back pain similar to kidney stone pain. Could also be MS pain. Vaginal pain of unclear etiology as well. Possible ovulation, urethral stone. Doubt ovarian torsion. Labs normal today. Offered pelvic US, but pt would like to give it some time to see if pain  resolves in the next week. Pt is non-toxic appearing. Doubt emergent condition.    ASSESSMENT 1. Hematuria   2. History of kidney stones   3. Vaginal pain    PLAN Discharge home in stable condition. Abd/pelvic pain red flags reviewed.  Ibuprophen on schedule x 2-3 days. Ultram PRN for breakthrough pain.   Follow-up Information    Follow up with [facility].   Specialty:  Obstetrics and Gynecology   Why:  if no improvment in 1 week. Will scheduleout-patient ultrasound as needed.    Contact information:   [address]  [address] [place] [address] [phone]      Follow up with Doris Cheadle, MD.   Specialty:  Internal Medicine   Why:  As needed if symptoms worsen   Contact information:   [address] [place] [address] [phone]         Medication List    STOP taking these medications        ibuprofen 600 MG tablet  Commonly known as:  ADVIL,MOTRIN     metroNIDAZOLE 500 MG tablet  Commonly known as:  FLAGYL      TAKE these medications        albuterol 108 (90 BASE) MCG/ACT inhaler  Commonly known as:  PROVENTIL HFA;VENTOLIN HFA  Inhale 2 puffs into the lungs every 6 (six) hours as needed. For wheezing.     multivitamin with minerals Tabs tablet  Take 1 tablet by mouth every morning.     norgestimate-ethinyl estradiol 0.25-35 MG-MCG tablet  Commonly known as:  ORTHO-CYCLEN,SPRINTEC,PREVIFEM  Take 1 tablet by mouth daily.     traMADol 50 MG tablet  Commonly known as:  ULTRAM  Take 1-2 tablets (50-100 mg total) by mouth every 6 (six) hours as needed for severe pain.       Kenneth, CNM 12/11/2014  9:08 AM

## 2014-12-11 NOTE — Discharge Instructions (Signed)
Abdominal Pain, Women °Abdominal (stomach, pelvic, or belly) pain can be caused by many things. It is important to tell your doctor: °· The location of the pain. °· Does it come and go or is it present all the time? °· Are there things that start the pain (eating certain foods, exercise)? °· Are there other symptoms associated with the pain (fever, nausea, vomiting, diarrhea)? °All of this is helpful to know when trying to find the cause of the pain. °CAUSES  °· Stomach: virus or bacteria infection, or ulcer. °· Intestine: appendicitis (inflamed appendix), regional ileitis (Crohn's disease), ulcerative colitis (inflamed colon), irritable bowel syndrome, diverticulitis (inflamed diverticulum of the colon), or cancer of the stomach or intestine. °· Gallbladder disease or stones in the gallbladder. °· Kidney disease, kidney stones, or infection. °· Pancreas infection or cancer. °· Fibromyalgia (pain disorder). °· Diseases of the female organs: °¨ Uterus: fibroid (non-cancerous) tumors or infection. °¨ Fallopian tubes: infection or tubal pregnancy. °¨ Ovary: cysts or tumors. °¨ Pelvic adhesions (scar tissue). °¨ Endometriosis (uterus lining tissue growing in the pelvis and on the pelvic organs). °¨ Pelvic congestion syndrome (female organs filling up with blood just before the menstrual period). °¨ Pain with the menstrual period. °¨ Pain with ovulation (producing an egg). °¨ Pain with an IUD (intrauterine device, birth control) in the uterus. °¨ Cancer of the female organs. °· Functional pain (pain not caused by a disease, may improve without treatment). °· Psychological pain. °· Depression. °DIAGNOSIS  °Your doctor will decide the seriousness of your pain by doing an examination. °· Blood tests. °· X-rays. °· Ultrasound. °· CT scan (computed tomography, special type of X-ray). °· MRI (magnetic resonance imaging). °· Cultures, for infection. °· Barium enema (dye inserted in the large intestine, to better view it with  X-rays). °· Colonoscopy (looking in intestine with a lighted tube). °· Laparoscopy (minor surgery, looking in abdomen with a lighted tube). °· Major abdominal exploratory surgery (looking in abdomen with a large incision). °TREATMENT  °The treatment will depend on the cause of the pain.  °· Many cases can be observed and treated at home. °· Over-the-counter medicines recommended by your caregiver. °· Prescription medicine. °· Antibiotics, for infection. °· Birth control pills, for painful periods or for ovulation pain. °· Hormone treatment, for endometriosis. °· Nerve blocking injections. °· Physical therapy. °· Antidepressants. °· Counseling with a psychologist or psychiatrist. °· Minor or major surgery. °HOME CARE INSTRUCTIONS  °· Do not take laxatives, unless directed by your caregiver. °· Take over-the-counter pain medicine only if ordered by your caregiver. Do not take aspirin because it can cause an upset stomach or bleeding. °· Try a clear liquid diet (broth or water) as ordered by your caregiver. Slowly move to a bland diet, as tolerated, if the pain is related to the stomach or intestine. °· Have a thermometer and take your temperature several times a day, and record it. °· Bed rest and sleep, if it helps the pain. °· Avoid sexual intercourse, if it causes pain. °· Avoid stressful situations. °· Keep your follow-up appointments and tests, as your caregiver orders. °· If the pain does not go away with medicine or surgery, you may try: °¨ Acupuncture. °¨ Relaxation exercises (yoga, meditation). °¨ Group therapy. °¨ Counseling. °SEEK MEDICAL CARE IF:  °· You notice certain foods cause stomach pain. °· Your home care treatment is not helping your pain. °· You need stronger pain medicine. °· You want your IUD removed. °· You feel faint or   lightheaded. °· You develop nausea and vomiting. °· You develop a rash. °· You are having side effects or an allergy to your medicine. °SEEK IMMEDIATE MEDICAL CARE IF:  °· Your  pain does not go away or gets worse. °· You have a fever. °· Your pain is felt only in portions of the abdomen. The right side could possibly be appendicitis. The left lower portion of the abdomen could be colitis or diverticulitis. °· You are passing blood in your stools (bright red or black tarry stools, with or without vomiting). °· You have blood in your urine. °· You develop chills, with or without a fever. °· You pass out. °MAKE SURE YOU:  °· Understand these instructions. °· Will watch your condition. °· Will get help right away if you are not doing well or get worse. °Document Released: 02/20/2007 Document Revised: 09/09/2013 Document Reviewed: 03/12/2009 °[employer]® Patient Information ©2015 [employer]. This information is not intended to replace advice given to you by your health care provider. Make sure you discuss any questions you have with your health care provider. ° °

## 2014-12-12 LAB — GC/CHLAMYDIA PROBE AMP (~~LOC~~) NOT AT ARMC
CHLAMYDIA, DNA PROBE: NEGATIVE
Neisseria Gonorrhea: NEGATIVE

## 2015-04-06 ENCOUNTER — Ambulatory Visit: Payer: Self-pay

## 2015-04-20 ENCOUNTER — Ambulatory Visit: Payer: Self-pay | Attending: Family Medicine

## 2015-04-29 NOTE — Telephone Encounter (Signed)
error 

## 2015-07-17 ENCOUNTER — Telehealth: Payer: Self-pay | Admitting: Family

## 2015-07-17 DIAGNOSIS — R6889 Other general symptoms and signs: Secondary | ICD-10-CM

## 2015-07-17 DIAGNOSIS — R05 Cough: Secondary | ICD-10-CM

## 2015-07-17 DIAGNOSIS — R059 Cough, unspecified: Secondary | ICD-10-CM

## 2015-07-17 MED ORDER — BENZONATATE 200 MG PO CAPS: 200.0000 mg | ORAL_CAPSULE | Freq: Two times a day (BID) | ORAL | Status: DC | PRN

## 2015-07-17 NOTE — Progress Notes (Signed)
E visit for Flu like symptoms   We are sorry that you are not feeling well.  Here is how we plan to help! Based on what you have shared with me it looks like you may have a respiratory virus that may be influenza.  Influenza or "the flu" is   an infection caused by a respiratory virus. The flu virus is highly contagious and persons who did not receive their yearly flu vaccination may "catch" the flu from close contact.  We have anti-viral medications to treat the viruses that cause this infection. They are not a "cure" and only shorten the course of the infection. These prescriptions are most effective when they are given within the first 2 days of "flu" symptoms. Antiviral medication are indicated if you have a high risk of complications from the flu. You should  also consider an antiviral medication if you are in close contact with someone who is at risk. These medications can help patients avoid complications from the flu  but have side effects that you should know. Possible side effects from Tamiflu or oseltamivir include nausea, vomiting, diarrhea, dizziness, headaches, eye redness, sleep problems or other respiratory symptoms. You should not take Tamiflu if you have an allergy to oseltamivir or any to the ingredients in Tamiflu.  Based upon your symptoms and potential risk factors I recommend that you follow the flu symptoms recommendation that I have listed below. It has been over the 48 hour time frame to receive Tamiflu. I have sent in a prescription for Tessalon Perles to help with cough.   ANYONE WHO HAS FLU SYMPTOMS SHOULD: . Stay home. The flu is highly contagious and going out or to work exposes others! . Be sure to drink plenty of fluids. Water is fine as well as fruit juices, sodas and electrolyte beverages. You may want to stay away from caffeine or alcohol. If you are nauseated, try taking small sips of liquids. How do you know if you are getting enough fluid? Your urine should be a  pale yellow or almost colorless. . Get rest. . Taking a steamy shower or using a humidifier may help nasal congestion and ease sore throat pain. Using a saline nasal spray works much the same way. . Cough drops, hard candies and sore throat lozenges may ease your cough. . Line up a caregiver. Have someone check on you regularly.   GET HELP RIGHT AWAY IF: . You cannot keep down liquids or your medications. . You become short of breath . Your fell like you are going to pass out or loose consciousness. . Your symptoms persist after you have completed your treatment plan MAKE SURE YOU   Understand these instructions.  Will watch your condition.  Will get help right away if you are not doing well or get worse.  Your e-visit answers were reviewed by a board certified advanced clinical practitioner to complete your personal care plan.  Depending on the condition, your plan could have included both over the counter or prescription medications.  If there is a problem please reply  once you have received a response from your provider.  Your safety is important to us.  If you have drug allergies check your prescription carefully.    You can use MyChart to ask questions about today's visit, request a non-urgent call back, or ask for a work or school excuse for 24 hours related to this e-Visit. If it has been greater than 24 hours you will need to follow  up with your provider, or enter a new e-Visit to address those concerns.  You will get an e-mail in the next two days asking about your experience.  I hope that your e-visit has been valuable and will speed your recovery. Thank you for using e-visits.

## 2015-12-10 ENCOUNTER — Encounter: Payer: Self-pay | Admitting: Dietician

## 2015-12-10 ENCOUNTER — Encounter: Payer: BLUE CROSS/BLUE SHIELD | Attending: *Deleted | Admitting: Dietician

## 2015-12-10 NOTE — Progress Notes (Signed)
  Medical Nutrition Therapy:  Appt start time: 0325 end time:  400.   Assessment:  Primary concerns today: Tanya Hayes is here today to discuss her weight and diet. She plans to have a breast reduction and wants to meet the BMI requirements for that. She states she is also interested in changing her eating habits. 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 Tries to get up 1x an hour to walk; patient reports back pain when she sits too long. Tries to always take the steps to her 3rd floor office and started going to the gym consistently about a month ago. Has also started meal prepping and has been surrounding herself with supportive people. [name] does all the grocery shopping and cooking for her family. Goes to the grocery store 1x a week with a list. Has lost 4 pounds in the last month. Does not like sweets but craves starchy foods, especially potatoes.   Preferred Learning Style:   No preference indicated   Learning Readiness:   Ready   MEDICATIONS: albuterol inhaler   DIETARY INTAKE:  Usual eating pattern includes 3 meals and 2 snacks per day. Avoided foods include pork and other red meats.    24-hr recall:  Wakes up around 7am B (9 AM): water with "Plexus" sugar free flavor and "accelerator", cottage cheese with fruit  Snk (11 AM): almonds or pretzels  L (1 PM): protein, green vegetable, and white rice Snk (3 PM): triscuits and cheese D (7 PM): meat, green vegetable, starch Snk ( PM): none  Beverages: water only, sometimes coffee  Usual physical activity: stretching, 10 min on elliptical, 30-min circuit (3x a week); 20 minute walk on days she does not work out; 2 mile walk on Sundays  Estimated energy needs: 1800-2000 calories  Progress Towards Goal(s):  In progress.   Nutritional Diagnosis:  [place]-3.3 Overweight/obesity As related to history of sedentary lifestyle and inappropriate food choices.  As evidenced by BMI 37.    Intervention:   Nutrition counseling provided. Praised patient on recent healthy lifestyle changes.  Goals: -Continue with your exercise and keeping working up to your goal of 5 days a week -Continue to surround yourself with positive, supportive people -Continue to pre-portion snacks -Follow the 80/20 rule -Continue to eat a variety of foods -Add cut up fruit (lemon, lime, berries, cucumbers, mint, melon)  -Get creative!  -Seltzer/sparkling water  -Herbal tea (hot or cold)  Teaching Method Utilized:  Visual Auditory Hands on  Handouts given during visit include:  Plate Method  Barriers to learning/adherence to lifestyle change: none  Demonstrated degree of understanding via:  Teach Back   Monitoring/Evaluation:  Dietary intake, exercise, and body weight prn.

## 2015-12-10 NOTE — Patient Instructions (Addendum)
-  Continue with your exercise and keeping working up to your goal of 5 days a week  -Continue to surround yourself with positive, supportive people  -Continue to pre-portion snacks  -Follow the 80/20 rule  -Continue to eat a variety of foods  -Add cut up fruit (lemon, lime, berries, cucumbers, mint, melon)  -Get creative!  -Seltzer/sparkling water  -Herbal tea (hot or cold)

## 2015-12-12 ENCOUNTER — Inpatient Hospital Stay (HOSPITAL_COMMUNITY)
Admission: AD | Admit: 2015-12-12 | Discharge: 2015-12-12 | Disposition: A | Discharge status: Home / Self Care | Payer: BLUE CROSS/BLUE SHIELD | Source: Ambulatory Visit | Attending: Obstetrics and Gynecology | Admitting: Obstetrics and Gynecology

## 2015-12-12 ENCOUNTER — Encounter (HOSPITAL_COMMUNITY): Payer: Self-pay | Admitting: *Deleted

## 2015-12-12 DIAGNOSIS — R109 Unspecified abdominal pain: Secondary | ICD-10-CM | POA: Insufficient documentation

## 2015-12-12 DIAGNOSIS — N938 Other specified abnormal uterine and vaginal bleeding: Secondary | ICD-10-CM

## 2015-12-12 DIAGNOSIS — E669 Obesity, unspecified: Secondary | ICD-10-CM | POA: Diagnosis not present

## 2015-12-12 DIAGNOSIS — E282 Polycystic ovarian syndrome: Secondary | ICD-10-CM | POA: Diagnosis not present

## 2015-12-12 DIAGNOSIS — Z6837 Body mass index (BMI) 37.0-37.9, adult: Secondary | ICD-10-CM | POA: Diagnosis not present

## 2015-12-12 DIAGNOSIS — Z833 Family history of diabetes mellitus: Secondary | ICD-10-CM | POA: Diagnosis not present

## 2015-12-12 DIAGNOSIS — N939 Abnormal uterine and vaginal bleeding, unspecified: Secondary | ICD-10-CM | POA: Diagnosis present

## 2015-12-12 LAB — CBC WITH DIFFERENTIAL/PLATELET
BASOS ABS: 0 10*3/uL (ref 0.0–0.1)
BASOS PCT: 0 %
EOS ABS: 0.2 10*3/uL (ref 0.0–0.7)
EOS PCT: 3 %
HCT: 37.9 % (ref 36.0–46.0)
Hemoglobin: 12.9 g/dL (ref 12.0–15.0)
Lymphocytes Relative: 46 %
Lymphs Abs: 3.3 10*3/uL (ref 0.7–4.0)
MCH: 30.9 pg (ref 26.0–34.0)
MCHC: 34 g/dL (ref 30.0–36.0)
MCV: 90.7 fL (ref 78.0–100.0)
Monocytes Absolute: 0.6 10*3/uL (ref 0.1–1.0)
Monocytes Relative: 8 %
Neutro Abs: 3.1 10*3/uL (ref 1.7–7.7)
Neutrophils Relative %: 43 %
PLATELETS: 302 10*3/uL (ref 150–400)
RBC: 4.18 MIL/uL (ref 3.87–5.11)
RDW: 12.3 % (ref 11.5–15.5)
WBC: 7.2 10*3/uL (ref 4.0–10.5)

## 2015-12-12 LAB — URINALYSIS, ROUTINE W REFLEX MICROSCOPIC
BILIRUBIN URINE: NEGATIVE
Glucose, UA: NEGATIVE mg/dL
Ketones, ur: NEGATIVE mg/dL
Leukocytes, UA: NEGATIVE
NITRITE: NEGATIVE
PROTEIN: NEGATIVE mg/dL
Specific Gravity, Urine: >1.03 — ABNORMAL HIGH (ref 1.005–1.030)
pH: 6 (ref 5.0–8.0)

## 2015-12-12 LAB — POCT PREGNANCY, URINE: Preg Test, Ur: NEGATIVE

## 2015-12-12 LAB — URINE MICROSCOPIC-ADD ON

## 2015-12-12 NOTE — MAU Provider Note (Signed)
History   30 BF not pregnant in with c/o vag bleeding x 3 wks heavy with clots over past 3 days with cramping . Is on sprintec, has not missed any pills. But was changed to another pill two moths ago and did not tolerate it so switched back to sprintec this month.pt is on last week of oc's before dummy pills.  CSN: [id number]  Arrival date & time 12/12/15  1820   First Provider Initiated Contact with Patient 12/12/15 1841      Chief Complaint  Patient presents with  . Vaginal Bleeding  . Abdominal Cramping    HPI  Past Medical History:  Diagnosis Date  . Asthma   . Obesity   . PCOS (polycystic ovarian syndrome)     History reviewed. No pertinent surgical history.  Family History  Problem Relation Age of Onset  . Hypertension Maternal Grandmother   . Thyroid disease Maternal Grandmother   . Stroke Maternal Grandmother   . Diabetes Father   . Stroke Maternal Grandfather     Social History  Substance Use Topics  . Smoking status: Never Smoker  . Smokeless tobacco: Never Used  . Alcohol use Yes     Comment: occassionally beer once every 2 weeks or wine less than once a month    OB History    Gravida Para Term Preterm AB Living   2 2 2     2    SAB TAB Ectopic Multiple Live Births                  Review of Systems  Constitutional: Negative.   HENT: Negative.   Eyes: Negative.   Respiratory: Negative.   Cardiovascular: Negative.   Gastrointestinal: Positive for abdominal pain.  Endocrine: Negative.   Genitourinary: Positive for vaginal bleeding.  Musculoskeletal: Negative.   Skin: Negative.   Allergic/Immunologic: Negative.   Neurological: Negative.   Hematological: Negative.   Psychiatric/Behavioral: Negative.     Allergies  Review of patient's allergies indicates no known allergies.  Home Medications    BP 126/67 (BP Location: Left Arm)   Pulse 105   Temp 98.5 F (36.9 C) (Oral)   Resp 16   Ht 5\' 5"  (1.651 m)   Wt 224 lb (101.6 kg)   BMI  37.28 kg/m   Physical Exam  Constitutional: She is oriented to person, place, and time. She appears well-developed and well-nourished.  HENT:  Head: Normocephalic.  Eyes: Pupils are equal, round, and reactive to light.  Neck: Normal range of motion.  Cardiovascular: Normal rate, regular rhythm, normal heart sounds and intact distal pulses.   Pulmonary/Chest: Effort normal and breath sounds normal.  Abdominal: Soft. Bowel sounds are normal.  Genitourinary: Vagina normal and uterus normal.  Musculoskeletal: Normal range of motion.  Neurological: She is alert and oriented to person, place, and time. She has normal reflexes.  Skin: Skin is warm and dry.  Psychiatric: She has a normal mood and affect. Her behavior is normal. Judgment and thought content normal.    MAU Course  Procedures (including critical care time)  Labs Reviewed  URINALYSIS, ROUTINE W REFLEX MICROSCOPIC (NOT AT [facility])  CBC WITH DIFFERENTIAL/PLATELET  POCT PREGNANCY, URINE   No results found.   No diagnosis found.    MDM  Dx: DUB Exam: spec exam cervix smooth, pink, bulbous, no lesions. uterus NSSC, small amt dark vag bleeding. P: discard present pack of pills and start new pack of pills tomorrow. hgb stable will d/c home

## 2015-12-12 NOTE — MAU Note (Signed)
Period for 3 weeks passed two clots today, having abdominal pain which she states is the worse she has ever had.

## 2015-12-12 NOTE — Discharge Instructions (Signed)

## 2016-03-18 ENCOUNTER — Encounter (HOSPITAL_COMMUNITY): Payer: Self-pay | Admitting: Emergency Medicine

## 2016-03-18 ENCOUNTER — Emergency Department (HOSPITAL_COMMUNITY)
Admission: EM | Admit: 2016-03-18 | Discharge: 2016-03-18 | Disposition: A | Discharge status: Home / Self Care | Payer: BLUE CROSS/BLUE SHIELD | Attending: Emergency Medicine | Admitting: Emergency Medicine

## 2016-03-18 DIAGNOSIS — R55 Syncope and collapse: Secondary | ICD-10-CM | POA: Insufficient documentation

## 2016-03-18 DIAGNOSIS — J45909 Unspecified asthma, uncomplicated: Secondary | ICD-10-CM | POA: Diagnosis not present

## 2016-03-18 DIAGNOSIS — Y92000 Kitchen of unspecified non-institutional (private) residence as  the place of occurrence of the external cause: Secondary | ICD-10-CM | POA: Insufficient documentation

## 2016-03-18 DIAGNOSIS — H81391 Other peripheral vertigo, right ear: Secondary | ICD-10-CM | POA: Insufficient documentation

## 2016-03-18 DIAGNOSIS — W1830XA Fall on same level, unspecified, initial encounter: Secondary | ICD-10-CM | POA: Insufficient documentation

## 2016-03-18 DIAGNOSIS — Y9301 Activity, walking, marching and hiking: Secondary | ICD-10-CM | POA: Diagnosis not present

## 2016-03-18 DIAGNOSIS — R42 Dizziness and giddiness: Secondary | ICD-10-CM | POA: Diagnosis present

## 2016-03-18 DIAGNOSIS — Y999 Unspecified external cause status: Secondary | ICD-10-CM | POA: Insufficient documentation

## 2016-03-18 LAB — BASIC METABOLIC PANEL
Anion gap: 7 (ref 5–15)
BUN: 10 mg/dL (ref 6–20)
CALCIUM: 8.8 mg/dL — AB (ref 8.9–10.3)
CO2: 24 mmol/L (ref 22–32)
Chloride: 107 mmol/L (ref 101–111)
Creatinine, Ser: 0.72 mg/dL (ref 0.44–1.00)
GFR calc non Af Amer: >60 mL/min (ref >60)
GLUCOSE: 104 mg/dL — AB (ref 65–99)
Potassium: 4.2 mmol/L (ref 3.5–5.1)
Sodium: 138 mmol/L (ref 135–145)

## 2016-03-18 LAB — CBC
HCT: 37.3 % (ref 36.0–46.0)
Hemoglobin: 12.2 g/dL (ref 12.0–15.0)
MCH: 30 pg (ref 26.0–34.0)
MCHC: 32.7 g/dL (ref 30.0–36.0)
MCV: 91.6 fL (ref 78.0–100.0)
PLATELETS: 260 10*3/uL (ref 150–400)
RBC: 4.07 MIL/uL (ref 3.87–5.11)
RDW: 12.1 % (ref 11.5–15.5)
WBC: 7 10*3/uL (ref 4.0–10.5)

## 2016-03-18 LAB — URINALYSIS, ROUTINE W REFLEX MICROSCOPIC
BILIRUBIN URINE: NEGATIVE
GLUCOSE, UA: NEGATIVE mg/dL
HGB URINE DIPSTICK: NEGATIVE
Ketones, ur: NEGATIVE mg/dL
Leukocytes, UA: NEGATIVE
Nitrite: NEGATIVE
PROTEIN: NEGATIVE mg/dL
Specific Gravity, Urine: 1.027 (ref 1.005–1.030)
pH: 5.5 (ref 5.0–8.0)

## 2016-03-18 LAB — CBG MONITORING, ED: Glucose-Capillary: 103 mg/dL — ABNORMAL HIGH (ref 65–99)

## 2016-03-18 LAB — POC URINE PREG, ED: Preg Test, Ur: NEGATIVE

## 2016-03-18 MED ORDER — MECLIZINE HCL 25 MG PO TABS: 25.0000 mg | ORAL_TABLET | Freq: Three times a day (TID) | ORAL | 0 refills | Status: DC | PRN

## 2016-03-18 MED ORDER — SODIUM CHLORIDE 0.9 % IV BOLUS (SEPSIS)
2000.0000 mL | Freq: Once | INTRAVENOUS | Status: AC
  Administered 2016-03-18: 2000 mL via INTRAVENOUS

## 2016-03-18 MED ORDER — MECLIZINE HCL 25 MG PO TABS
25.0000 mg | ORAL_TABLET | Freq: Once | ORAL | Status: AC
  Administered 2016-03-18: 25 mg via ORAL
  Filled 2016-03-18: qty 1

## 2016-03-18 NOTE — ED Provider Notes (Signed)
  Physical Exam  BP 126/77   Pulse 80   Temp 98.7 F (37.1 C) (Oral)   Resp 14   Ht 5\' 5"  (1.651 m)   Wt 220 lb (99.8 kg)   LMP  (Within Weeks)   SpO2 100%   BMI 36.61 kg/m   Physical Exam  ED Course  Procedures  MDM Received patient in signout. Had episode of vertigo. Feels somewhat better but still having some dizziness. Discussed with patient about possible causes. Likely peripheral vertigo. Labs reassuring. Headache improved. Feels better but some difficulty walking asked walk slow. Doubt stroke and other abnormality such as MS felt less likely. Will discharge to follow-up as needed.       Benjiman CoreNathan Izekiel Flegel, MD 03/18/16 [phone]

## 2016-03-18 NOTE — ED Provider Notes (Signed)
[facility]-EMERGENCY DEPT Provider Note   CSN: [id number] Arrival date & time: 03/18/16  0430     History   Chief Complaint Chief Complaint  Patient presents with  . Loss of Consciousness  . Dizziness    HPI Tanya Hayes is a 30 y.o. female.  HPI Patient began having a spinning sensation yesterday evening around 6 PM. Associated with nausea. She's had several days of sinus pressure and pressure sensation in the right ear. States she woke up several times tonight with ongoing sensation of spinning and then one episode of vomiting. While walking to the kitchen this morning patient fell and struck her head on a coffee table. Had loss of consciousness for unknown period of time. Thinks she remembers falling. She has ongoing spinning sensation and nausea. This is worse with position change. She has no focal weakness or numbness. Denies any neck pain. Has mild generalized headache. No visual changes. No recent fevers or chills. Past Medical History:  Diagnosis Date  . Asthma   . Obesity   . PCOS (polycystic ovarian syndrome)     Patient Active Problem List   Diagnosis Date Noted  . Headache, migraine 11/14/2013  . Obesity, unspecified 11/14/2013  . Hypertriglyceridemia 07/15/2013  . Unspecified vitamin D deficiency 07/15/2013  . Unspecified asthma(493.90) 06/03/2013  . PCOS (polycystic ovarian syndrome) 10/12/2011  . Secondary amenorrhea 09/07/2011  . Obesity 09/07/2011  . Hirsutism 09/07/2011  . Acanthosis nigricans 09/07/2011    History reviewed. No pertinent surgical history.  OB History    Gravida Para Term Preterm AB Living   2 2 2     2    SAB TAB Ectopic Multiple Live Births                   Home Medications    Prior to Admission medications   Medication Sig Start Date End Date Taking? Authorizing Provider  albuterol (PROVENTIL HFA;VENTOLIN HFA) 108 (90 BASE) MCG/ACT inhaler Inhale 2 puffs into the lungs every 6 (six) hours as needed. For wheezing. 12/04/14  Yes  Waldon MerlWilliam C Martin, PA-C  norgestimate-ethinyl estradiol (ORTHO-CYCLEN,SPRINTEC,PREVIFEM) 0.25-35 MG-MCG tablet Take 1 tablet by mouth daily. 06/12/14  Yes Willodean Rosenthalarolyn Harraway-Smith, MD  meclizine (ANTIVERT) 25 MG tablet Take 1 tablet (25 mg total) by mouth 3 (three) times daily as needed for dizziness. 03/18/16   Loren Raceravid Catarina Huntley, MD    Family History Family History  Problem Relation Age of Onset  . Hypertension Maternal Grandmother   . Thyroid disease Maternal Grandmother   . Stroke Maternal Grandmother   . Diabetes Father   . Stroke Maternal Grandfather     Social History Social History  Substance Use Topics  . Smoking status: Never Smoker  . Smokeless tobacco: Never Used  . Alcohol use Yes     Comment: occassionally beer once every 2 weeks or wine less than once a month     Allergies   Patient has no known allergies.   Review of Systems Review of Systems  Constitutional: Negative for chills and fever.  HENT: Positive for ear pain and sinus pressure. Negative for ear discharge and facial swelling.   Eyes: Negative for visual disturbance.  Respiratory: Negative for cough and shortness of breath.   Cardiovascular: Negative for chest pain, palpitations and leg swelling.  Gastrointestinal: Positive for nausea and vomiting. Negative for abdominal pain, constipation and diarrhea.  Genitourinary: Negative for dysuria, flank pain, frequency and hematuria.  Musculoskeletal: Negative for back pain, myalgias and neck pain.  Skin: Negative for  rash and wound.  Neurological: Positive for dizziness, syncope and headaches. Negative for speech difficulty, weakness, light-headedness and numbness.  All other systems reviewed and are negative.    Physical Exam Updated Vital Signs BP 126/77   Pulse 80   Temp 98.7 F (37.1 C) (Oral)   Resp 14   Ht 5\' 5"  (1.651 m)   Wt 220 lb (99.8 kg)   LMP  (Within Weeks)   SpO2 100%   BMI 36.61 kg/m   Physical Exam  Constitutional: She is oriented  to person, place, and time. She appears well-developed and well-nourished. No distress.  HENT:  Head: Normocephalic and atraumatic.  Mouth/Throat: Oropharynx is clear and moist.  Patient has frontal sinus tenderness with percussion. Bilateral nasal mucosal edema. She has fluid behind the right TM. Oropharynx is clear. No tongue trauma. Small hematoma to the left parietal scalp. No underlying bony deformity.  Eyes: EOM are normal. Pupils are equal, round, and reactive to light.  Several beats of rotary nystagmus that is fatigable.  Neck: Normal range of motion. Neck supple.  No posterior midline cervical tenderness to palpation.  Cardiovascular: Normal rate and regular rhythm.   Pulmonary/Chest: Effort normal and breath sounds normal.  Abdominal: Soft. Bowel sounds are normal. There is no tenderness. There is no rebound and no guarding.  Musculoskeletal: Normal range of motion. She exhibits no edema or tenderness.  No midline thoracic or lumbar tenderness. Pelvis is stable. 2+ dorsalis and posterior tibial pulses. No lower extremity swelling, asymmetry or tenderness.  Neurological: She is alert and oriented to person, place, and time.  Patient is alert and oriented x3 with clear, goal oriented speech. Patient has 5/5 motor in all extremities. Sensation is intact to light touch. Bilateral finger-to-nose is normal with no signs of dysmetria.  Skin: Skin is warm and dry. No rash noted. No erythema.  Psychiatric: She has a normal mood and affect. Her behavior is normal.  Nursing note and vitals reviewed.    ED Treatments / Results  Labs (all labs ordered are listed, but only abnormal results are displayed) Labs Reviewed  BASIC METABOLIC PANEL - Abnormal; Notable for the following:       Result Value   Glucose, Bld 104 (*)    Calcium 8.8 (*)    All other components within normal limits  URINALYSIS, ROUTINE W REFLEX MICROSCOPIC (NOT AT [facility]) - Abnormal; Notable for the following:     APPearance CLOUDY (*)    All other components within normal limits  CBG MONITORING, ED - Abnormal; Notable for the following:    Glucose-Capillary 103 (*)    All other components within normal limits  CBC  POC URINE PREG, ED    EKG  EKG Interpretation None       Radiology No results found.  Procedures Procedures (including critical care time)  Medications Ordered in ED Medications  meclizine (ANTIVERT) tablet 25 mg (25 mg Oral Given 03/18/16 0546)  sodium chloride 0.9 % bolus 2,000 mL (0 mLs Intravenous Stopped 03/18/16 0922)  meclizine (ANTIVERT) tablet 25 mg (25 mg Oral Given 03/18/16 0645)     Initial Impression / Assessment and Plan / ED Course  I have reviewed the triage vital signs and the nursing notes.  Pertinent labs & imaging results that were available during my care of the patient were reviewed by me and considered in my medical decision making (see chart for details).  Clinical Course    Patient with symptoms consistent with peripheral vertigo. We'll  treat symptomatically and reevaluate. It is uncertain whether she had a syncopal episode prior to her fall or had closed head injury and syncope after the fall. She currently has a normal neurologic exam. Do not believe that emergent imaging is necessary at this point.  Final Clinical Impressions(s) / ED Diagnoses   Final diagnoses:  Peripheral vertigo involving right ear  Syncope, unspecified syncope type    New Prescriptions Discharge Medication List as of 03/18/2016  9:37 AM    START taking these medications   Details  meclizine (ANTIVERT) 25 MG tablet Take 1 tablet (25 mg total) by mouth 3 (three) times daily as needed for dizziness., Starting Fri 03/18/2016, Print         Loren Raceravid Nilam Quakenbush, MD 03/19/16 [phone]

## 2016-03-18 NOTE — ED Notes (Signed)
Pt still dizzy when attempting to get to side of bed , MD at bedside

## 2016-03-18 NOTE — ED Triage Notes (Signed)
Pt from home. Sts she woke up this morning, felt dizzy and then passed out. Sts she head her head on a coffee table. Doesn't know how long she was out for. Rates head pain @ 7/10. Endorse n/v & dizziness. A&O x4 @ this time.

## 2016-09-13 ENCOUNTER — Other Ambulatory Visit: Payer: Self-pay | Admitting: Plastic Surgery

## 2017-03-29 ENCOUNTER — Encounter (HOSPITAL_COMMUNITY): Payer: Self-pay

## 2017-04-09 ENCOUNTER — Encounter (HOSPITAL_COMMUNITY): Payer: Self-pay | Admitting: *Deleted

## 2017-04-09 ENCOUNTER — Emergency Department (HOSPITAL_COMMUNITY)
Admission: EM | Admit: 2017-04-09 | Discharge: 2017-04-09 | Disposition: A | Discharge status: Home / Self Care | Payer: BLUE CROSS/BLUE SHIELD | Attending: Emergency Medicine | Admitting: Emergency Medicine

## 2017-04-09 ENCOUNTER — Emergency Department (HOSPITAL_COMMUNITY): Payer: BLUE CROSS/BLUE SHIELD

## 2017-04-09 DIAGNOSIS — M25512 Pain in left shoulder: Secondary | ICD-10-CM

## 2017-04-09 DIAGNOSIS — R21 Rash and other nonspecific skin eruption: Secondary | ICD-10-CM | POA: Diagnosis not present

## 2017-04-09 DIAGNOSIS — H5711 Ocular pain, right eye: Secondary | ICD-10-CM | POA: Insufficient documentation

## 2017-04-09 DIAGNOSIS — J45909 Unspecified asthma, uncomplicated: Secondary | ICD-10-CM | POA: Diagnosis not present

## 2017-04-09 DIAGNOSIS — H0289 Other specified disorders of eyelid: Secondary | ICD-10-CM

## 2017-04-09 MED ORDER — FLUORESCEIN SODIUM 1 MG OP STRP
1.0000 | ORAL_STRIP | Freq: Once | OPHTHALMIC | Status: AC
  Administered 2017-04-09: 1 via OPHTHALMIC
  Filled 2017-04-09: qty 1

## 2017-04-09 MED ORDER — TETRACAINE HCL 0.5 % OP SOLN
2.0000 [drp] | Freq: Once | OPHTHALMIC | Status: AC
  Administered 2017-04-09: 2 [drp] via OPHTHALMIC
  Filled 2017-04-09: qty 4

## 2017-04-09 MED ORDER — MOXIFLOXACIN HCL 0.5 % OP SOLN: 1.0000 [drp] | Freq: Two times a day (BID) | OPHTHALMIC | 0 refills | Status: AC

## 2017-04-09 NOTE — ED Triage Notes (Signed)
Pt has multiple complaints. Reports right eye swelling and discharge. Has left shoulder pain since heavy lifting, decreased ROM. Has white patches to her vaginal area. No acute distress is noted at triage.

## 2017-04-09 NOTE — Discharge Instructions (Addendum)
Skin lesions Please follow up with OBGYN on the skin lesions. They need to be evaluated further and may need to be biopsied.  Eye pain Apply warm compresses to the area. Apply the antibiotic drops 1 drop in the right eye twice a day for 7 days. Do not wear your contacts or put anything else in the eye until you follow up with ophthalmology.  Shoulder pain You have been seen today for shoulder pain. There were no acute abnormalities on the x-rays, including no sign of fracture or dislocation. Pain: Take 600 mg of ibuprofen every 6 hours or 440 mg (over the counter dose) to 500 mg (prescription dose) of naproxen every 12 hours for the next 3 days. After this time, these medications may be used as needed for pain. Take these medications with food to avoid upset stomach. Choose only one of these medications, do not take them together.  Tylenol: Should you continue to have additional pain while taking the ibuprofen or naproxen, you may add in tylenol as needed. Your daily total maximum amount of tylenol from all sources should be limited to 4000mg /day for persons without liver problems, or 2000mg /day for those with liver problems. Ice: May apply ice to the area over the next 24 hours for 15 minutes at a time to reduce swelling. Elevation: Keep the extremity elevated as often as possible to reduce pain and inflammation. Exercises: Start by performing these exercises a few times a week, increasing the frequency until you are performing them twice daily.  Follow up: If symptoms are improving, you may follow up with your primary care provider for any continued management. If symptoms are not improving, you may follow up with the orthopedic specialist.

## 2017-04-09 NOTE — ED Provider Notes (Signed)
[facility] EMERGENCY DEPARTMENT Provider Note   CSN: [id number] Arrival date & time: 04/09/17  0913     History   Chief Complaint Chief Complaint  Patient presents with  . Eye Problem  . Shoulder Pain  . Vaginal Discharge    HPI Tanya Hayes is a 31 y.o. female.  HPI   Tanya Hayes is a 31 y.o. female, with a history of asthma and obesity, presenting to the ED with multiple complaints.  Right eye discomfort beginning this morning. Minimal yellow crusting this morning, no further drainage. Patient is a contact lenses user.  States she changes her contacts for new pair every 3 weeks.  Does not wear them when she sleeps. Sees optometrist at a business called "[facility] on [address]." Denies vision abnormalities or trauma.   Left shoulder pain for the last month since an instance of heavy lifting. Rates it 5/10, soreness, nonradiating.  Has been seen at [facility] for this issue. Was prescribed prednisone with no improvement. Endorses pain with ROM. Denies numbness or weakness.  White vaginal spots multiplying over the last two weeks. Underwent a telemedicine appointment Nov 18, was prescribed a topical antifungal. Used this for a week, but it has worsened. Had a STD check at [facility]. Denies itching, discharge, abdominal pain, fever, or any other complaints.      Past Medical History:  Diagnosis Date  . Asthma   . Obesity   . PCOS (polycystic ovarian syndrome)     Patient Active Problem List   Diagnosis Date Noted  . Headache, migraine 11/14/2013  . Obesity, unspecified 11/14/2013  . Hypertriglyceridemia 07/15/2013  . Unspecified vitamin D deficiency 07/15/2013  . Unspecified asthma(493.90) 06/03/2013  . PCOS (polycystic ovarian syndrome) 10/12/2011  . Secondary amenorrhea 09/07/2011  . Obesity 09/07/2011  . Hirsutism 09/07/2011  . Acanthosis nigricans 09/07/2011    History reviewed. No pertinent surgical history.  OB History    Gravida Para Term Preterm AB Living   2 2 2     2    SAB TAB Ectopic Multiple Live Births                   Home Medications    Prior to Admission medications   Medication Sig Start Date End Date Taking? Authorizing Provider  albuterol (PROVENTIL HFA;VENTOLIN HFA) 108 (90 BASE) MCG/ACT inhaler Inhale 2 puffs into the lungs every 6 (six) hours as needed. For wheezing. 12/04/14  Yes Waldon Merl, PA-C  norgestimate-ethinyl estradiol (ORTHO-CYCLEN,SPRINTEC,PREVIFEM) 0.25-35 MG-MCG tablet Take 1 tablet by mouth daily. 06/12/14  Yes Willodean Rosenthal, MD  meclizine (ANTIVERT) 25 MG tablet Take 1 tablet (25 mg total) by mouth 3 (three) times daily as needed for dizziness. Patient not taking: Reported on 04/09/2017 03/18/16   Loren Racer, MD  moxifloxacin (VIGAMOX) 0.5 % ophthalmic solution Place 1 drop into the right eye 2 (two) times daily for 7 days. 04/09/17 04/16/17  Anselm Pancoast, PA-C    Family History Family History  Problem Relation Age of Onset  . Hypertension Maternal Grandmother   . Thyroid disease Maternal Grandmother   . Stroke Maternal Grandmother   . Diabetes Father   . Stroke Maternal Grandfather     Social History Social History   Tobacco Use  . Smoking status: Never Smoker  . Smokeless tobacco: Never Used  Substance Use Topics  . Alcohol use: Yes    Comment: occassionally beer once every 2 weeks or wine less than once a month  .  Drug use: No     Allergies   Patient has no known allergies.   Review of Systems Review of Systems  Constitutional: Negative for chills, diaphoresis and fever.  Eyes: Positive for pain and discharge. Negative for photophobia and visual disturbance.  Gastrointestinal: Negative for abdominal pain, nausea and vomiting.  Genitourinary: Negative for vaginal discharge.       "white spots on vagina"  Musculoskeletal: Positive for arthralgias.  Neurological: Negative for weakness and numbness.  All other systems reviewed and  are negative.    Physical Exam Updated Vital Signs BP 131/89 (BP Location: Right Arm)   Pulse 94   Temp 98.8 F (37.1 C) (Oral)   Resp 18   LMP 03/22/2017   SpO2 100%   Physical Exam  Constitutional: She appears well-developed and well-nourished. No distress.  HENT:  Head: Normocephalic and atraumatic.  Eyes: Conjunctivae and EOM are normal. Pupils are equal, round, and reactive to light.  Tenderness and erythema to the medial right upper eyelid.  Suspicious for possible developing hordeolum. No scleral or conjunctival injection.  No periorbital edema.  No contact lenses in place.  Woods Lamp exam shows no increased uptake of fluorescein. Slit lamp exam was also performed with no noted signs of corneal abrasion or ulcer, iritis, anterior chamber damage, foreign bodies, or globe damage.  No noted dendritic lesions No noted cells or flare No noted angle changes when compared to opposite eye. Tono-Pen values: Right eye: 17  Left eye: 16    Visual Acuity  Right Eye Distance: 10/10 Left Eye Distance: 10/12.5 Bilateral Distance:    Right Eye Near:   Left Eye Near:    Bilateral Near:      Neck: Neck supple.  Cardiovascular: Normal rate, regular rhythm, normal heart sounds and intact distal pulses.  Pulmonary/Chest: Effort normal and breath sounds normal. No respiratory distress.  Abdominal: Soft. There is no tenderness. There is no guarding.  Genitourinary:  Genitourinary Comments: Greater than 10 areas of rough plaque-like lesions of various sizes ranging from approximately 1 cm round to approximately 5x2 cm across the vulva, pubis, and inguinal folds. RN, Cindi, served as Biomedical engineerchaperone during exam.  Musculoskeletal: She exhibits tenderness. She exhibits no edema.  Some mild tenderness to the posterior superior left shoulder.  Full passive and active range of motion.  Pain elicited with abduction and external rotation.  No noted swelling, instability, or crepitus.    Lymphadenopathy:    She has no cervical adenopathy.  Neurological: She is alert.  No noted sensory deficits in the upper extremities bilaterally. Strength 5/5 in the cardinal directions of the bilateral shoulders, elbows, and wrists.  Skin: Skin is warm and dry. Capillary refill takes less than 2 seconds. She is not diaphoretic.  Psychiatric: She has a normal mood and affect. Her behavior is normal.  Nursing note and vitals reviewed.  Pictures below were taken with the patient's explicit verbal permission. Left inginal fold:          ED Treatments / Results  Labs (all labs ordered are listed, but only abnormal results are displayed) Labs Reviewed - No data to display  EKG  EKG Interpretation None       Radiology Dg Shoulder Left  Result Date: 04/09/2017 CLINICAL DATA:  Left shoulder pain and decreased range of motion since recent heavy lifting. Initial encounter. EXAM: LEFT SHOULDER - 2+ VIEW COMPARISON:  None. FINDINGS: There is no evidence of acute fracture or dislocation. A well corticated linear ossific density is  seen along the superior margin of the acromion process, likely representing an old avulsion injury, with accessory ossification center considered less likely. IMPRESSION: No acute findings. Electronically Signed   By: Myles RosenthalJohn  Stahl M.D.   On: 04/09/2017 13:34    Procedures Procedures (including critical care time)  Medications Ordered in ED Medications  fluorescein ophthalmic strip 1 strip (1 strip Right Eye Given 04/09/17 1207)  tetracaine (PONTOCAINE) 0.5 % ophthalmic solution 2 drop (2 drops Both Eyes Given 04/09/17 1207)     Initial Impression / Assessment and Plan / ED Course  I have reviewed the triage vital signs and the nursing notes.  Pertinent labs & imaging results that were available during my care of the patient were reviewed by me and considered in my medical decision making (see chart for details).     Patient presents with multiple  complaints.  Suspect the eyelid pain may be due to an early hordeolum. Shoulder injury shows no red flag symptoms.  Orthopedic follow-up.  Patient declined sling. Pubic lesions will need to be addressed by OB/GYN as soon as possible.  Differential includes lichen variant vs. Genital warts.  Patient voices understanding of all instructions and is comfortable with discharge.  Final Clinical Impressions(s) / ED Diagnoses   Final diagnoses:  Eyelid pain, right  Acute pain of left shoulder    ED Discharge Orders        Ordered    moxifloxacin (VIGAMOX) 0.5 % ophthalmic solution  2 times daily     04/09/17 1326       Anselm PancoastJoy, Shawn C, PA-C 04/09/17 1442    Wynetta FinesMessick, Peter C, MD 04/09/17 [phone]

## 2017-06-16 DIAGNOSIS — M7989 Other specified soft tissue disorders: Secondary | ICD-10-CM | POA: Insufficient documentation

## 2017-06-28 DIAGNOSIS — S63408A Traumatic rupture of unspecified ligament of other finger at metacarpophalangeal and interphalangeal joint, initial encounter: Secondary | ICD-10-CM | POA: Insufficient documentation

## 2017-06-28 DIAGNOSIS — M79644 Pain in right finger(s): Secondary | ICD-10-CM | POA: Insufficient documentation

## 2017-07-19 DIAGNOSIS — M542 Cervicalgia: Secondary | ICD-10-CM | POA: Insufficient documentation

## 2017-09-14 ENCOUNTER — Ambulatory Visit (HOSPITAL_COMMUNITY)
Admission: EM | Admit: 2017-09-14 | Discharge: 2017-09-14 | Disposition: A | Discharge status: Home / Self Care | Payer: BLUE CROSS/BLUE SHIELD | Attending: Family Medicine | Admitting: Family Medicine

## 2017-09-14 ENCOUNTER — Encounter (HOSPITAL_COMMUNITY): Payer: Self-pay | Admitting: Emergency Medicine

## 2017-09-14 DIAGNOSIS — L0231 Cutaneous abscess of buttock: Secondary | ICD-10-CM | POA: Diagnosis not present

## 2017-09-14 MED ORDER — SULFAMETHOXAZOLE-TRIMETHOPRIM 800-160 MG PO TABS: 1.0000 | ORAL_TABLET | Freq: Two times a day (BID) | ORAL | 0 refills | Status: AC

## 2017-09-14 NOTE — ED Triage Notes (Signed)
Pt c/o cyst or abscess in between buttocks, states she is unsure of what it looks like. Woke up with it yesterday morning.

## 2017-09-14 NOTE — ED Provider Notes (Signed)
[facility]    CSN: [id number] Arrival date & time: 09/14/17  1755     History   Chief Complaint Chief Complaint  Patient presents with  . Abscess    HPI Tanya Hayes is a 32 y.o. female.   Tanya Hayes presents with complaints of painful swollen area to inside of right buttock which she noticed yesterday. She states she has noticed some yellow drainage. Has had abscesses under arms, denies any previous I&D. Endorses history of MRSA. Has tried applying heat which has not helped. No fevers. Hx asthma, PCOS   ROS per HPI.      Past Medical History:  Diagnosis Date  . Asthma   . Obesity   . PCOS (polycystic ovarian syndrome)     Patient Active Problem List   Diagnosis Date Noted  . Headache, migraine 11/14/2013  . Obesity, unspecified 11/14/2013  . Hypertriglyceridemia 07/15/2013  . Unspecified vitamin D deficiency 07/15/2013  . Unspecified asthma(493.90) 06/03/2013  . PCOS (polycystic ovarian syndrome) 10/12/2011  . Secondary amenorrhea 09/07/2011  . Obesity 09/07/2011  . Hirsutism 09/07/2011  . Acanthosis nigricans 09/07/2011    History reviewed. No pertinent surgical history.  OB History    Gravida  2   Para  2   Term  2   Preterm      AB      Living  2     SAB      TAB      Ectopic      Multiple      Live Births               Home Medications    Prior to Admission medications   Medication Sig Start Date End Date Taking? Authorizing Provider  Etonogestrel (NEXPLANON [place]) Inject into the skin.   Yes [provider]  methocarbamol (ROBAXIN) 500 MG tablet Take 500 mg by mouth 4 (four) times daily.   Yes [provider]  albuterol (PROVENTIL HFA;VENTOLIN HFA) 108 (90 BASE) MCG/ACT inhaler Inhale 2 puffs into the lungs every 6 (six) hours as needed. For wheezing. 12/04/14   Waldon Merl, PA-C  sulfamethoxazole-trimethoprim (BACTRIM DS) 800-160 MG tablet Take 1 tablet by mouth 2 (two) times daily for 7 days.  09/14/17 09/21/17  Georgetta Haber, NP    Family History Family History  Problem Relation Age of Onset  . Hypertension Maternal Grandmother   . Thyroid disease Maternal Grandmother   . Stroke Maternal Grandmother   . Diabetes Father   . Stroke Maternal Grandfather     Social History Social History   Tobacco Use  . Smoking status: Never Smoker  . Smokeless tobacco: Never Used  Substance Use Topics  . Alcohol use: Yes    Comment: occassionally beer once every 2 weeks or wine less than once a month  . Drug use: No     Allergies   Patient has no known allergies.   Review of Systems Review of Systems   Physical Exam Triage Vital Signs ED Triage Vitals [09/14/17 1833]  Enc Vitals Group     BP 125/71     Pulse Rate (!) 102     Resp 18     Temp 98.8 F (37.1 C)     Temp src      SpO2 99 %     Weight      Height      Head Circumference      Peak Flow      Pain  Score      Pain Loc      Pain Edu?      Excl. in GC?    No data found.  Updated Vital Signs BP 125/71   Pulse (!) 102   Temp 98.8 F (37.1 C)   Resp 18   SpO2 99%    Physical Exam  Constitutional: She is oriented to person, place, and time. She appears well-developed and well-nourished. No distress.  Cardiovascular: Normal rate, regular rhythm and normal heart sounds.  Pulmonary/Chest: Effort normal and breath sounds normal.  Neurological: She is alert and oriented to person, place, and time.  Skin: Skin is warm and dry.     Soft open hair follicle to inside of right buttock with mild tenderness; without active drainage; no fluctuance, minimal drainage     UC Treatments / Results  Labs (all labs ordered are listed, but only abnormal results are displayed) Labs Reviewed - No data to display  EKG None  Radiology No results found.  Procedures Procedures (including critical care time)  Medications Ordered in UC Medications - No data to display  Initial Impression / Assessment and  Plan / UC Course  I have reviewed the triage vital signs and the nursing notes.  Pertinent labs & imaging results that were available during my care of the patient were reviewed by me and considered in my medical decision making (see chart for details).     Hx MRSA with previous abscesses, tender draining area to buttocks, without evidence of need for I&D at this time. 7 days of bactrim initiated. Warm compresses recommended. Return precautions provided. Patient verbalized understanding and agreeable to plan.   Final Clinical Impressions(s) / UC Diagnoses   Final diagnoses:  Abscess of buttock, right     Discharge Instructions     It appears this is already draining, nothing palpable or visible today to further incise to drain. Complete course of antibiotics.  Warm compresses/soaks 3-4 times a day. Tylenol and/or ibuprofen as needed for pain. If symptoms worsen or do not improve in the next week to return to be seen or to follow up with your PCP.     ED Prescriptions    Medication Sig Dispense Auth. Provider   sulfamethoxazole-trimethoprim (BACTRIM DS) 800-160 MG tablet Take 1 tablet by mouth 2 (two) times daily for 7 days. 14 tablet Georgetta Haber, NP     Controlled Substance Prescriptions [place] Controlled Substance Registry consulted? Not Applicable   Georgetta Haber, NP 09/14/17 [phone]

## 2017-09-14 NOTE — Discharge Instructions (Signed)
It appears this is already draining, nothing palpable or visible today to further incise to drain. Complete course of antibiotics.  Warm compresses/soaks 3-4 times a day. Tylenol and/or ibuprofen as needed for pain. If symptoms worsen or do not improve in the next week to return to be seen or to follow up with your PCP.

## 2017-09-19 ENCOUNTER — Other Ambulatory Visit: Payer: Self-pay | Admitting: Orthopedic Surgery

## 2017-10-18 ENCOUNTER — Other Ambulatory Visit: Payer: Self-pay

## 2017-10-18 ENCOUNTER — Encounter (HOSPITAL_BASED_OUTPATIENT_CLINIC_OR_DEPARTMENT_OTHER): Payer: Self-pay | Admitting: *Deleted

## 2017-10-26 ENCOUNTER — Ambulatory Visit (HOSPITAL_BASED_OUTPATIENT_CLINIC_OR_DEPARTMENT_OTHER): Payer: BLUE CROSS/BLUE SHIELD | Admitting: Anesthesiology

## 2017-10-26 ENCOUNTER — Encounter (HOSPITAL_BASED_OUTPATIENT_CLINIC_OR_DEPARTMENT_OTHER): Payer: Self-pay

## 2017-10-26 ENCOUNTER — Ambulatory Visit (HOSPITAL_BASED_OUTPATIENT_CLINIC_OR_DEPARTMENT_OTHER)
Admission: RE | Admit: 2017-10-26 | Discharge: 2017-10-26 | Disposition: A | Discharge status: Home / Self Care | Payer: BLUE CROSS/BLUE SHIELD | Source: Ambulatory Visit | Attending: Orthopedic Surgery | Admitting: Orthopedic Surgery

## 2017-10-26 ENCOUNTER — Other Ambulatory Visit: Payer: Self-pay

## 2017-10-26 ENCOUNTER — Encounter (HOSPITAL_BASED_OUTPATIENT_CLINIC_OR_DEPARTMENT_OTHER)
Admission: RE | Disposition: A | Discharge status: Home / Self Care | Payer: Self-pay | Source: Ambulatory Visit | Attending: Orthopedic Surgery

## 2017-10-26 DIAGNOSIS — Z8249 Family history of ischemic heart disease and other diseases of the circulatory system: Secondary | ICD-10-CM | POA: Insufficient documentation

## 2017-10-26 DIAGNOSIS — E669 Obesity, unspecified: Secondary | ICD-10-CM | POA: Diagnosis not present

## 2017-10-26 DIAGNOSIS — R51 Headache: Secondary | ICD-10-CM | POA: Diagnosis not present

## 2017-10-26 DIAGNOSIS — Z823 Family history of stroke: Secondary | ICD-10-CM | POA: Insufficient documentation

## 2017-10-26 DIAGNOSIS — E282 Polycystic ovarian syndrome: Secondary | ICD-10-CM | POA: Diagnosis not present

## 2017-10-26 DIAGNOSIS — X58XXXA Exposure to other specified factors, initial encounter: Secondary | ICD-10-CM | POA: Insufficient documentation

## 2017-10-26 DIAGNOSIS — Z833 Family history of diabetes mellitus: Secondary | ICD-10-CM | POA: Diagnosis not present

## 2017-10-26 DIAGNOSIS — S66114A Strain of flexor muscle, fascia and tendon of right ring finger at wrist and hand level, initial encounter: Secondary | ICD-10-CM | POA: Diagnosis present

## 2017-10-26 DIAGNOSIS — Z8349 Family history of other endocrine, nutritional and metabolic diseases: Secondary | ICD-10-CM | POA: Insufficient documentation

## 2017-10-26 DIAGNOSIS — Z6837 Body mass index (BMI) 37.0-37.9, adult: Secondary | ICD-10-CM | POA: Diagnosis not present

## 2017-10-26 DIAGNOSIS — J45909 Unspecified asthma, uncomplicated: Secondary | ICD-10-CM | POA: Insufficient documentation

## 2017-10-26 HISTORY — PX: CLOSED REDUCTION FINGER WITH PERCUTANEOUS PINNING: SHX5612

## 2017-10-26 HISTORY — PX: REPAIR EXTENSOR TENDON: SHX5382

## 2017-10-26 HISTORY — PX: TRIGGER FINGER RELEASE: SHX641

## 2017-10-26 SURGERY — RELEASE, A1 PULLEY, FOR TRIGGER FINGER
Anesthesia: Monitor Anesthesia Care | Site: Ring Finger | Laterality: Right

## 2017-10-26 MED ORDER — MIDAZOLAM HCL 2 MG/2ML IJ SOLN
INTRAMUSCULAR | Status: AC
  Filled 2017-10-26: qty 2

## 2017-10-26 MED ORDER — FENTANYL CITRATE (PF) 100 MCG/2ML IJ SOLN: 25.0000 ug | INTRAMUSCULAR | Status: DC | PRN

## 2017-10-26 MED ORDER — FENTANYL CITRATE (PF) 100 MCG/2ML IJ SOLN
INTRAMUSCULAR | Status: AC
  Filled 2017-10-26: qty 2

## 2017-10-26 MED ORDER — SCOPOLAMINE 1 MG/3DAYS TD PT72: 1.0000 | MEDICATED_PATCH | Freq: Once | TRANSDERMAL | Status: DC | PRN

## 2017-10-26 MED ORDER — CEFAZOLIN SODIUM-DEXTROSE 2-4 GM/100ML-% IV SOLN
INTRAVENOUS | Status: AC
  Filled 2017-10-26: qty 100

## 2017-10-26 MED ORDER — BUPIVACAINE-EPINEPHRINE (PF) 0.5% -1:200000 IJ SOLN
INTRAMUSCULAR | Status: DC | PRN
  Administered 2017-10-26: 30 mL via PERINEURAL

## 2017-10-26 MED ORDER — METOCLOPRAMIDE HCL 5 MG/ML IJ SOLN: 10.0000 mg | Freq: Once | INTRAMUSCULAR | Status: DC | PRN

## 2017-10-26 MED ORDER — MEPERIDINE HCL 25 MG/ML IJ SOLN: 6.2500 mg | INTRAMUSCULAR | Status: DC | PRN

## 2017-10-26 MED ORDER — ONDANSETRON HCL 4 MG/2ML IJ SOLN
INTRAMUSCULAR | Status: DC | PRN
  Administered 2017-10-26: 4 mg via INTRAVENOUS

## 2017-10-26 MED ORDER — FENTANYL CITRATE (PF) 100 MCG/2ML IJ SOLN
50.0000 ug | INTRAMUSCULAR | Status: AC | PRN
  Administered 2017-10-26 (×3): 50 ug via INTRAVENOUS

## 2017-10-26 MED ORDER — PROPOFOL 500 MG/50ML IV EMUL
INTRAVENOUS | Status: DC | PRN
  Administered 2017-10-26: 125 ug/kg/min via INTRAVENOUS

## 2017-10-26 MED ORDER — PROPOFOL 10 MG/ML IV BOLUS
INTRAVENOUS | Status: DC | PRN
  Administered 2017-10-26: 20 mg via INTRAVENOUS

## 2017-10-26 MED ORDER — MIDAZOLAM HCL 2 MG/2ML IJ SOLN
1.0000 mg | INTRAMUSCULAR | Status: DC | PRN
  Administered 2017-10-26 (×2): 2 mg via INTRAVENOUS

## 2017-10-26 MED ORDER — HYDROCODONE-ACETAMINOPHEN 5-325 MG PO TABS: 1.0000 | ORAL_TABLET | Freq: Four times a day (QID) | ORAL | 0 refills | Status: DC | PRN

## 2017-10-26 MED ORDER — CEFAZOLIN SODIUM-DEXTROSE 2-4 GM/100ML-% IV SOLN
2.0000 g | INTRAVENOUS | Status: AC
  Administered 2017-10-26: 2 g via INTRAVENOUS

## 2017-10-26 MED ORDER — LACTATED RINGERS IV SOLN
INTRAVENOUS | Status: DC
  Administered 2017-10-26: 11:00:00 via INTRAVENOUS

## 2017-10-26 MED ORDER — CHLORHEXIDINE GLUCONATE 4 % EX LIQD: 60.0000 mL | Freq: Once | CUTANEOUS | Status: DC

## 2017-10-26 MED ORDER — HYDROCODONE-ACETAMINOPHEN 7.5-325 MG PO TABS: 1.0000 | ORAL_TABLET | Freq: Once | ORAL | Status: DC | PRN

## 2017-10-26 SURGICAL SUPPLY — 76 items
BAG DECANTER FOR FLEXI CONT (MISCELLANEOUS) IMPLANT
BANDAGE COBAN STERILE 2 (GAUZE/BANDAGES/DRESSINGS) ×3 IMPLANT
BLADE MINI RND TIP GREEN BEAV (BLADE) IMPLANT
BLADE SURG 15 STRL LF DISP TIS (BLADE) ×1 IMPLANT
BLADE SURG 15 STRL SS (BLADE) ×2
BNDG COHESIVE 3X5 TAN STRL LF (GAUZE/BANDAGES/DRESSINGS) ×3 IMPLANT
BNDG COHESIVE 4X5 TAN STRL (GAUZE/BANDAGES/DRESSINGS) ×3 IMPLANT
BNDG ESMARK 4X9 LF (GAUZE/BANDAGES/DRESSINGS) IMPLANT
BNDG GAUZE ELAST 4 BULKY (GAUZE/BANDAGES/DRESSINGS) ×3 IMPLANT
CHLORAPREP W/TINT 26ML (MISCELLANEOUS) ×3 IMPLANT
CORD BIPOLAR FORCEPS 12FT (ELECTRODE) ×3 IMPLANT
COTTONBALL LRG STERILE PKG (GAUZE/BANDAGES/DRESSINGS) IMPLANT
COVER BACK TABLE 60X90IN (DRAPES) ×3 IMPLANT
COVER MAYO STAND STRL (DRAPES) ×3 IMPLANT
CUFF TOURNIQUET SINGLE 18IN (TOURNIQUET CUFF) IMPLANT
DECANTER SPIKE VIAL GLASS SM (MISCELLANEOUS) IMPLANT
DRAIN TLS ROUND 10FR (DRAIN) IMPLANT
DRAPE EXTREMITY T 121X128X90 (DRAPE) ×3 IMPLANT
DRAPE OEC MINIVIEW 54X84 (DRAPES) ×3 IMPLANT
DRAPE SURG 17X23 STRL (DRAPES) ×3 IMPLANT
GAUZE SPONGE 4X4 12PLY STRL (GAUZE/BANDAGES/DRESSINGS) ×3 IMPLANT
GAUZE SPONGE 4X4 16PLY XRAY LF (GAUZE/BANDAGES/DRESSINGS) IMPLANT
GAUZE XEROFORM 1X8 LF (GAUZE/BANDAGES/DRESSINGS) ×3 IMPLANT
GLOVE BIOGEL PI IND STRL 8.5 (GLOVE) ×1 IMPLANT
GLOVE BIOGEL PI INDICATOR 8.5 (GLOVE) ×2
GLOVE SURG ORTHO 8.0 STRL STRW (GLOVE) ×3 IMPLANT
GOWN STRL REUS W/ TWL LRG LVL3 (GOWN DISPOSABLE) ×1 IMPLANT
GOWN STRL REUS W/TWL LRG LVL3 (GOWN DISPOSABLE) ×2
GOWN STRL REUS W/TWL XL LVL3 (GOWN DISPOSABLE) ×3 IMPLANT
K-WIRE .035 (WIRE) ×3
K-WIRE .035X4 (WIRE) IMPLANT
KWIRE .035 (WIRE) ×1 IMPLANT
LOOP VESSEL MAXI BLUE (MISCELLANEOUS) IMPLANT
NEEDLE HYPO 22GX1.5 SAFETY (NEEDLE) IMPLANT
NEEDLE KEITH (NEEDLE) IMPLANT
NEEDLE PRECISIONGLIDE 27X1.5 (NEEDLE) ×3 IMPLANT
NS IRRIG 1000ML POUR BTL (IV SOLUTION) ×3 IMPLANT
PACK BASIN DAY SURGERY FS (CUSTOM PROCEDURE TRAY) ×3 IMPLANT
PAD CAST 3X4 CTTN HI CHSV (CAST SUPPLIES) ×1 IMPLANT
PAD CAST 4YDX4 CTTN HI CHSV (CAST SUPPLIES) ×1 IMPLANT
PADDING CAST ABS 3INX4YD NS (CAST SUPPLIES)
PADDING CAST ABS 4INX4YD NS (CAST SUPPLIES) ×2
PADDING CAST ABS COTTON 3X4 (CAST SUPPLIES) IMPLANT
PADDING CAST ABS COTTON 4X4 ST (CAST SUPPLIES) ×1 IMPLANT
PADDING CAST COTTON 3X4 STRL (CAST SUPPLIES) ×2
PADDING CAST COTTON 4X4 STRL (CAST SUPPLIES) ×2
SLEEVE SCD COMPRESS KNEE MED (MISCELLANEOUS) IMPLANT
SLING ARM FOAM STRAP LRG (SOFTGOODS) ×3 IMPLANT
SPLINT PLASTER CAST XFAST 3X15 (CAST SUPPLIES) ×1 IMPLANT
SPLINT PLASTER XTRA FASTSET 3X (CAST SUPPLIES) ×2
STOCKINETTE 4X48 STRL (DRAPES) ×3 IMPLANT
SUT CHROMIC 5 0 P 3 (SUTURE) IMPLANT
SUT ETHIBOND 3-0 V-5 (SUTURE) IMPLANT
SUT ETHILON 4 0 PS 2 18 (SUTURE) ×3 IMPLANT
SUT ETHILON 5 0 PC 1 (SUTURE) ×3 IMPLANT
SUT FIBERWIRE 2-0 18 17.9 3/8 (SUTURE)
SUT FIBERWIRE 4-0 18 TAPR NDL (SUTURE)
SUT MERSILENE 2.0 SH NDLE (SUTURE) IMPLANT
SUT MERSILENE 4 0 P 3 (SUTURE) IMPLANT
SUT MNCRL AB 4-0 PS2 18 (SUTURE) ×3 IMPLANT
SUT PROLENE 2 0 SH DA (SUTURE) IMPLANT
SUT SILK 2 0 PERMA HAND 18 BK (SUTURE) IMPLANT
SUT SILK 4 0 PS 2 (SUTURE) IMPLANT
SUT STEEL 3 0 (SUTURE) IMPLANT
SUT VIC AB 3-0 PS1 18 (SUTURE)
SUT VIC AB 3-0 PS1 18XBRD (SUTURE) IMPLANT
SUT VIC AB 4-0 P-3 18XBRD (SUTURE) IMPLANT
SUT VIC AB 4-0 P3 18 (SUTURE)
SUT VICRYL 4-0 PS2 18IN ABS (SUTURE) IMPLANT
SUTURE FIBERWR 2-0 18 17.9 3/8 (SUTURE) IMPLANT
SUTURE FIBERWR 4-0 18 TAPR NDL (SUTURE) IMPLANT
SYR BULB 3OZ (MISCELLANEOUS) ×3 IMPLANT
SYR CONTROL 10ML LL (SYRINGE) ×3 IMPLANT
TOWEL GREEN STERILE FF (TOWEL DISPOSABLE) ×6 IMPLANT
TUBE FEEDING ENTERAL 5FR 16IN (TUBING) IMPLANT
UNDERPAD 30X30 (UNDERPADS AND DIAPERS) ×3 IMPLANT

## 2017-10-26 NOTE — Progress Notes (Signed)
Assisted Dr. Foster with right, ultrasound guided, axillary block. Side rails up, monitors on throughout procedure. See vital signs in flow sheet. Tolerated Procedure well. 

## 2017-10-26 NOTE — Op Note (Signed)
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  RI reveals that she is rupture of the A2 pulley she has undergone considerable therapy to regain extension flexibility of the finger.  She is admitted now for reconstruction using extensor retinaculum right ring finger a 2 pulley.  She is aware that there is no guarantee to the surgery the possibility of infection recurrence injury to arteries nerves tendons and complete relief symptoms dystrophy possibility of stiffness.  She is aware that we will pin the proximal interphalangeal joint.  Preoperative area the patient is seen extremity marked by both patient and surgeon antibiotic given  Procedure: Patient brought to the operating room after a supraclavicular block was carried out in the preoperative area.  She is placed in supine position with the right arm free.  She was prepped using ChloraPrep.  A three-minute dry time was allowed timeout taken to confirm patient procedure.  The limb was exsanguinated with an Esmarch bandage turn placed in the upper arm was inflated to 250 mmHg.  A volar Bruner incision was made over the proximal phalanx L metacarpal head area of the right ring finger.  This carried down through subcutaneous tissue.  Bleeders were electrocauterized with bipolar.  Neurovascular bundles were identified radially and ulnarly.  A tract was made around the proximal phalanx with blunt dissection and confirmed with a right angle hemostat.  A template was made for autograft using the Esmarch bandage.  This was wrapped around the finger and measured.  A dorsal  incision was then made transversely over the wrist.  This was carried down through subcutaneous tissue.  Neurovascular structures identified protected.  The graft from the extensor retinaculum was then taken approximately a centimeter in width.  This was from the 2nd-6 dorsal compartment.  Care care was taken to protect the extensor tendons throughout the procedure and nerves dorsally.  Bleeders were electrocauterized possible.  The wound was irrigated.  Subcutaneous tissue was closed with interrupted 4-0 Vicryl and skin with a subcuticular 4-0 Monocryl suture.  Attention was then placed for the graft.  The sutures were placed in the end of the graft using 4-0 Mersilene.  This allowed the graft to be passed around the proximal phalanx by going deep to the extensor tendon and this was placed using the right angle hemostat.  A loop was then made in the graft this was passed through and sutured with the finger fully extended to bring the tendon up against the bone.  Multiple figure-of-eight 4-0 Mersilene sutures were used for the graft.  This was firmly affixed around the A2 pulley.  A2 pulley itself was left intact.  A 3 5 K wire was then used to pin the joint and full extension.  The wound was copiously irrigated with saline.  The skin then closed interrupted 4-0 nylon sutures.  X-rays confirm the positioning of the pain with full extension of the PIP joint this was then cut short.  Steri-Strips over benzoin there was then applied to the dorsal wound.  A sterile compressive dressing and splint to the middle ring and small finger applied.  Inflation of the tourniquet all fingers immediately pink.  She  was taken to the recovery room for observation in satisfactory condition.  [facility] Norco.  She will try to Tylenol and ibuprofen first if this is inadequate she will have Norco for breakthrough.

## 2017-10-26 NOTE — Op Note (Signed)
I assisted Surgeon(s) and Role:    * Cindee SaltKuzma, Gary, MD - Primary    Betha Loa* Morrill Bomkamp, MD - Assisting on the Procedure(s): RECONSTRUCTION A-2 PULLEY RIGHT RING FINGER EXTENSOR RETINACULUM GRAFT PINNING PIP JOINT on 10/26/2017.  I provided assistance on this case as follows: retraction soft tissues, harvest graft, passing of graft.  Electronically signed by: Tami RibasKUZMA,Rylen Swindler R, MD Date: 10/26/2017 Time: 12:26 PM

## 2017-10-26 NOTE — Anesthesia Preprocedure Evaluation (Signed)
Anesthesia Evaluation  Patient identified by MRN, date of birth, ID band Patient awake    Reviewed: Allergy & Precautions, NPO status , Patient's Chart, lab work & pertinent test results  Airway Mallampati: I  TM Distance: >3 FB Neck ROM: Full    Dental no notable dental hx. (+) Teeth Intact   Pulmonary asthma ,    Pulmonary exam normal breath sounds clear to auscultation       Cardiovascular negative cardio ROS Normal cardiovascular exam Rhythm:Regular Rate:Normal     Neuro/Psych  Headaches, negative psych ROS   GI/Hepatic negative GI ROS, Neg liver ROS,   Endo/Other  Obesity  Renal/GU negative Renal ROS  negative genitourinary   Musculoskeletal Rupture ring finger flexor tendon   Abdominal (+) + obese,   Peds  Hematology negative hematology ROS (+)   Anesthesia Other Findings   Reproductive/Obstetrics                             Anesthesia Physical Anesthesia Plan  ASA: II  Anesthesia Plan: MAC and Regional   Post-op Pain Management:    Induction:   PONV Risk Score and Plan: 3 and Ondansetron, Propofol infusion, Treatment may vary due to age or medical condition and Midazolam  Airway Management Planned: Natural Airway  Additional Equipment:   Intra-op Plan:   Post-operative Plan: Extubation in OR  Informed Consent: I have reviewed the patients History and Physical, chart, labs and discussed the procedure including the risks, benefits and alternatives for the proposed anesthesia with the patient or authorized representative who has indicated his/her understanding and acceptance.   Dental advisory given  Plan Discussed with: CRNA and Surgeon  Anesthesia Plan Comments:         Anesthesia Quick Evaluation

## 2017-10-26 NOTE — Discharge Instructions (Addendum)
Post Anesthesia Home Care Instructions  Activity: Get plenty of rest for the remainder of the day. A responsible individual must stay with you for 24 hours following the procedure.  For the next 24 hours, DO NOT: -Drive a car -Operate machinery -Drink alcoholic beverages -Take any medication unless instructed by your physician -Make any legal decisions or sign important papers.  Meals: Start with liquid foods such as gelatin or soup. Progress to regular foods as tolerated. Avoid greasy, spicy, heavy foods. If nausea and/or vomiting occur, drink only clear liquids until the nausea and/or vomiting subsides. Call your physician if vomiting continues.  Special Instructions/Symptoms: Your throat may feel dry or sore from the anesthesia or the breathing tube placed in your throat during surgery. If this causes discomfort, gargle with warm salt water. The discomfort should disappear within 24 hours.  If you had a scopolamine patch placed behind your ear for the management of post- operative nausea and/or vomiting:  1. The medication in the patch is effective for 72 hours, after which it should be removed.  Wrap patch in a tissue and discard in the trash. Wash hands thoroughly with soap and water. 2. You may remove the patch earlier than 72 hours if you experience unpleasant side effects which may include dry mouth, dizziness or visual disturbances. 3. Avoid touching the patch. Wash your hands with soap and water after contact with the patch.      Regional Anesthesia Blocks  1. Numbness or the inability to move the "blocked" extremity may last from 3-48 hours after placement. The length of time depends on the medication injected and your individual response to the medication. If the numbness is not going away after 48 hours, call your surgeon.  2. The extremity that is blocked will need to be protected until the numbness is gone and the  Strength has returned. Because you cannot feel it, you  will need to take extra care to avoid injury. Because it may be weak, you may have difficulty moving it or using it. You may not know what position it is in without looking at it while the block is in effect.  3. For blocks in the legs and feet, returning to weight bearing and walking needs to be done carefully. You will need to wait until the numbness is entirely gone and the strength has returned. You should be able to move your leg and foot normally before you try and bear weight or walk. You will need someone to be with you when you first try to ensure you do not fall and possibly risk injury.  4. Bruising and tenderness at the needle site are common side effects and will resolve in a few days.  5. Persistent numbness or new problems with movement should be communicated to the surgeon or the [facility] ([phone])/ [facility] ([phone]).    [facility] Center Instructions Hand Surgery  Wound Care: Keep your hand elevated above the level of your heart.  Do not allow it to dangle by your side.  Keep the dressing dry and do not remove it unless your doctor advises you to do so.  He will usually change it at the time of your post-op visit.  Moving your fingers is advised to stimulate circulation but will depend on the site of your surgery.  If you have a splint applied, your doctor will advise you regarding movement.  Activity: Do not drive or operate machinery today.  Rest today and   then you may return to your normal activity and work as indicated by your physician.  Diet:  Drink liquids today or eat a light diet.  You may resume a regular diet tomorrow.    General expectations: Pain for two to three days. Fingers may become slightly swollen.  Call your doctor if any of the following occur: Severe pain not relieved by pain medication. Elevated temperature. Dressing soaked with blood. Inability to move fingers. White or bluish color to fingers.  

## 2017-10-26 NOTE — Brief Op Note (Signed)
10/26/2017  12:31 PM  PATIENT:  Venida JarvisLatoya R Mendoza  32 y.o. female  PRE-OPERATIVE DIAGNOSIS:  RUPTURE FLEXOR SHEATH RIGHT RING FINGER  POST-OPERATIVE DIAGNOSIS:  RUPTURE FLEXOR SHEATH RIGHT RING FINGER  PROCEDURE:  Procedure(s): RECONSTRUCTION A-2 PULLEY RIGHT RING FINGER (Right) EXTENSOR RETINACULUM GRAFT (Right) PINNING PIP JOINT (Right)  SURGEON:  Surgeon(s) and Role:    * Cindee SaltKuzma, Bryon Parker, MD - Primary    * Betha LoaKuzma, Kevin, MD - Assisting  PHYSICIAN ASSISTANT:   ASSISTANTS: K Tasneem Cormier,MD  ANESTHESIA:   regional and IV sedation  EBL:561ml  BLOOD ADMINISTERED:none  DRAINS: none   LOCAL MEDICATIONS USED:  NONE  SPECIMEN:  No Specimen  DISPOSITION OF SPECIMEN:  N/A  COUNTS:  YES  TOURNIQUET:  * Missing tourniquet times found for documented tourniquets in log: [id number] *  DICTATION: .Dragon Dictation  PLAN OF CARE: Discharge to home after PACU  PATIENT DISPOSITION:  PACU - hemodynamically stable.

## 2017-10-26 NOTE — Anesthesia Procedure Notes (Signed)
Anesthesia Regional Block: Axillary brachial plexus block   Pre-Anesthetic Checklist: ,, timeout performed, Correct Patient, Correct Site, Correct Laterality, Correct Procedure, Correct Position, site marked, Risks and benefits discussed,  Surgical consent,  Pre-op evaluation,  At surgeon's request and post-op pain management  Laterality: Right  Prep: chloraprep       Needles:  Injection technique: Single-shot  Needle Type: Echogenic Stimulator Needle     Needle Length: 9cm  Needle Gauge: 21   Needle insertion depth: 6 cm   Additional Needles:   Procedures:,,,, ultrasound used (permanent image in chart),,,,  Narrative:  Start time: 10/26/2017 10:49 AM End time: 10/26/2017 11:54 AM Injection made incrementally with aspirations every 5 mL.  Performed by: Personally  Anesthesiologist: Mal AmabileFoster, Santanna Olenik, MD  Additional Notes: Timeout performed. Patient sedated. Relevant anatomy ID'd using US. Incremental 2-585ml injection of LA with frequent aspiration. Patient tolerated procedure well.

## 2017-10-26 NOTE — H&P (Signed)
Tanya JarvisLatoya Hayes Hayes is an 32 y.o. female.   Chief Complaint: flexor sheath rupture right ring fingerHPI: Tanya ArvinLatoya is a 32 year old left-hand-dominant female who comes in with a complaint of swelling of her right ring finger which has been going on for approximately 2 months. She recalls no history of injury. She complains of an aching type pain with a VAS score of 7/10. She feels though it is gradually getting worse it is primarily the right ring finger proximal phalanx that is swollen. She has had no fevers or chills. She has had no infections elsewhere. She was given Cleocin to take which gave her no relief. She has taken Aleve which has not given her any relief she has used ice without relief. She has no history of diabetes thyroid problems arthritis or gout. Family history is positive diabetes negative for thyroid problems arthritis and gout. She has been seen for right leg genetic urgent care. He states nothing seems to make it better or worse.She has been undergoing therapy for mobilization of the finger. She has had an MRI done which revealed a rupture of the flexor sheath with the A2 pulley.She has developed excellent mobility at the present time. Both actively and passively. Her swelling has significantly improved. She is not complaining of any significant pain and no numbness and tingling.           Past Medical History:  Diagnosis Date  . Asthma   . Obesity   . PCOS (polycystic ovarian syndrome)     Past Surgical History:  Procedure Laterality Date  . BREAST REDUCTION SURGERY      Family History  Problem Relation Age of Onset  . Hypertension Maternal Grandmother   . Thyroid disease Maternal Grandmother   . Stroke Maternal Grandmother   . Diabetes Father   . Stroke Maternal Grandfather    Social History:  reports that she has never smoked. She has never used smokeless tobacco. She reports that she drinks alcohol. She reports that she does not use drugs.  Allergies: No Known  Allergies  No medications prior to admission.    No results found for this or any previous visit (from the past 48 hour(s)).  No results found.   Pertinent items are noted in HPI.  Height 5\' 5"  (1.651 m), weight 102.1 kg (225 lb).  General appearance: alert, cooperative and appears stated age Head: Normocephalic, without obvious abnormality Neck: no JVD Resp: clear to auscultation bilaterally Cardio: regular rate and rhythm, S1, S2 normal, no murmur, click, rub or gallop GI: soft, non-tender; bowel sounds normal; no masses,  no organomegaly Extremities: sheath rupture right ring finger Pulses: 2+ and symmetric Skin: Skin color, texture, turgor normal. No rashes or lesions Neurologic: Grossly normal Incision/Wound: na  Assessment/Plan Assessment:  1. Rupture of flexor sheath pulley of finger    Plan: Would like to proceed to have this reconstruction. Pre-peri-and postoperative course are discussed along with risk applications. She is aware that there is no guarantee to the surgery the possibility of infection recurrence injury to arteries nerves tendons incomplete relief symptoms dystrophy. Scheduled for reconstruction flexor sheath A2 pulley right ring finger using extensor retinaculum as an outpatient under regional anesthesia. Aware the possibility of stiffness. We will plan on pinning the joint at the PIP level for protection.      Tanya Hayes 10/26/2017, 8:19 AM

## 2017-10-26 NOTE — Transfer of Care (Signed)
Immediate Anesthesia Transfer of Care Note  Patient: Tanya Hayes  Procedure(s) Performed: RECONSTRUCTION A-2 PULLEY RIGHT RING FINGER (Right Ring Finger) EXTENSOR RETINACULUM GRAFT (Right Ring Finger) PINNING PIP JOINT (Right Ring Finger)  Patient Location: PACU  Anesthesia Type:Regional and MAC combined with regional for post-op pain  Level of Consciousness: awake, alert , oriented and drowsy  Airway & Oxygen Therapy: Patient Spontanous Breathing and Patient connected to face mask oxygen  Post-op Assessment: Report given to RN and Post -op Vital signs reviewed and stable  Post vital signs: Reviewed and stable  Last Vitals:  Vitals Value Taken Time  BP    Temp    Pulse 95 10/26/2017 12:31 PM  Resp 16 10/26/2017 12:31 PM  SpO2 96 % 10/26/2017 12:31 PM  Vitals shown include unvalidated device data.  Last Pain:  Vitals:   10/26/17 1045  TempSrc:   PainSc: 0-No pain         Complications: No apparent anesthesia complications

## 2017-10-26 NOTE — Anesthesia Postprocedure Evaluation (Signed)
Anesthesia Post Note  Patient: Tanya Hayes  Procedure(s) Performed: RECONSTRUCTION A-2 PULLEY RIGHT RING FINGER (Right Ring Finger) EXTENSOR RETINACULUM GRAFT (Right Ring Finger) PINNING PIP JOINT (Right Ring Finger)     Patient location during evaluation: PACU Anesthesia Type: Regional and MAC Level of consciousness: awake and alert and oriented Pain management: pain level controlled Vital Signs Assessment: post-procedure vital signs reviewed and stable Respiratory status: spontaneous breathing, nonlabored ventilation and respiratory function stable Cardiovascular status: stable and blood pressure returned to baseline Postop Assessment: no apparent nausea or vomiting Anesthetic complications: no    Last Vitals:  Vitals:   10/26/17 1300 10/26/17 1326  BP: 129/71 131/79  Pulse: 80   Resp: 12 16  Temp:  36.6 C  SpO2: 95% 96%    Last Pain:  Vitals:   10/26/17 1326  TempSrc: Oral  PainSc: 0-No pain                 Xia Stohr A.

## 2017-10-27 ENCOUNTER — Encounter (HOSPITAL_BASED_OUTPATIENT_CLINIC_OR_DEPARTMENT_OTHER): Payer: Self-pay | Admitting: Orthopedic Surgery

## 2020-01-25 ENCOUNTER — Other Ambulatory Visit: Payer: Self-pay

## 2020-01-25 ENCOUNTER — Ambulatory Visit (HOSPITAL_COMMUNITY)
Admission: EM | Admit: 2020-01-25 | Discharge: 2020-01-25 | Disposition: A | Discharge status: Home / Self Care | Payer: BC Managed Care – PPO

## 2020-01-25 NOTE — ED Notes (Signed)
Pt LWBS due to wait time

## 2020-03-09 ENCOUNTER — Telehealth: Payer: BC Managed Care – PPO | Admitting: Family

## 2020-03-09 DIAGNOSIS — Z20822 Contact with and (suspected) exposure to covid-19: Secondary | ICD-10-CM

## 2020-03-09 MED ORDER — BENZONATATE 100 MG PO CAPS: 100.0000 mg | ORAL_CAPSULE | Freq: Three times a day (TID) | ORAL | 0 refills | Status: DC | PRN

## 2020-03-09 MED ORDER — ALBUTEROL SULFATE HFA 108 (90 BASE) MCG/ACT IN AERS: 2.0000 | INHALATION_SPRAY | Freq: Four times a day (QID) | RESPIRATORY_TRACT | 0 refills | Status: DC | PRN

## 2020-03-09 MED ORDER — DEXAMETHASONE 6 MG PO TABS: 6.0000 mg | ORAL_TABLET | Freq: Two times a day (BID) | ORAL | 0 refills | Status: DC

## 2020-03-09 NOTE — Progress Notes (Signed)
E-Visit for Corona Virus Screening  Your current symptoms could be consistent with the coronavirus.  Many health care providers can now test patients at their office but not all are.  [facility] has multiple testing sites. For information on our COVID testing locations and hours go to [email or web address]  We are enrolling you in our MyChart Home Monitoring for COVID19 . Daily you will receive a questionnaire within the MyChart website. Our COVID 19 response team will be monitoring your responses daily.  Testing Information: The COVID-19 Community Testing sites are testing BY APPOINTMENT ONLY.  You can schedule online at [email or web address]  If you do not have access to a smart phone or computer you may call [phone] for an appointment.   Additional testing sites in the Community:  . For [facility] Testing sites in [facility]  [email or web address].[email or web address]  . For Pop-up testing sites in [place]  [email or web address]  . For [facility] [email or web address]  . For [facility] testing in [place] and [employer] [email or web address]  . For [facility] testing in [facility]   [email or web address]  For  more information about community testing call [phone]   Please quarantine yourself while awaiting your test results. Please stay home for a minimum of 10 days from the first day of illness with improving symptoms and you have had 24 hours of no fever (without the use of Tylenol (Acetaminophen) Motrin (Ibuprofen) or any fever reducing medication).  Also - Do not get tested prior to returning to work because once you have had a positive test the test can stay  positive for more than a month in some cases.   You should wear a mask or cloth face covering over your nose and mouth if you must be around other people or animals, including pets (even at home). Try to stay at least 6 feet away from other people. This will protect the people around you.  Please continue good preventive care measures, including:  frequent hand-washing, avoid touching your face, cover coughs/sneezes, stay out of crowds and keep a 6 foot distance from others.  COVID-19 is a respiratory illness with symptoms that are similar to the flu. Symptoms are typically mild to moderate, but there have been cases of severe illness and death due to the virus.   The following symptoms may appear 2-14 days after exposure: . Fever . Cough . Shortness of breath or difficulty breathing . Chills . Repeated shaking with chills . Muscle pain . Headache . Sore throat . New loss of taste or smell . Fatigue . Congestion or runny nose . Nausea or vomiting . Diarrhea  Go to the nearest hospital ED for assessment if fever/cough/breathlessness are severe or illness seems like a threat to life.  It is vitally important that if you feel that you have an infection such as this virus or any other virus that you stay home and away from places where you may spread it to others.  You should avoid contact with people age 22 and older.   You can use medication such as A prescription cough medication called Tessalon Perles 100 mg. You may take 1-2 capsules every 8 hours as needed for cough and A prescription inhaler called Albuterol MDI 90 mcg /actuation 2 puffs every 4 hours as needed for shortness of breath, wheezing, cough and I have sent dexamethasone 6 mg twice a day for 7 days.   You may also take acetaminophen (Tylenol) as needed for fever.  Reduce your risk of any  infection by using the same precautions used for avoiding the common cold or flu:  Marland Kitchen Wash your hands often with soap and warm water for at  least 20 seconds.  If soap and water are not readily available, use an alcohol-based hand sanitizer with at least 60% alcohol.  . If coughing or sneezing, cover your mouth and nose by coughing or sneezing into the elbow areas of your shirt or coat, into a tissue or into your sleeve (not your hands). . Avoid shaking hands with others and consider head nods or verbal greetings only. . Avoid touching your eyes, nose, or mouth with unwashed hands.  . Avoid close contact with people who are sick. . Avoid places or events with large numbers of people in one location, like concerts or sporting events. . Carefully consider travel plans you have or are making. . If you are planning any travel outside or inside the [place], visit the CDC's Travelers' Health webpage for the latest health notices. . If you have some symptoms but not all symptoms, continue to monitor at home and seek medical attention if your symptoms worsen. . If you are having a medical emergency, call 911.  HOME CARE . Only take medications as instructed by your medical team. . Drink plenty of fluids and get plenty of rest. . A steam or ultrasonic humidifier can help if you have congestion.   GET HELP RIGHT AWAY IF YOU HAVE EMERGENCY WARNING SIGNS** FOR COVID-19. If you or someone is showing any of these signs seek emergency medical care immediately. Call 911 or proceed to your closest emergency facility if: . You develop worsening high fever. . Trouble breathing . Bluish lips or face . Persistent pain or pressure in the chest . New confusion . Inability to wake or stay awake . You cough up blood. . Your symptoms become more severe  **This list is not all possible symptoms. Contact your medical provider for any symptoms that are sever or concerning to you.  MAKE SURE YOU   Understand these instructions.  Will watch your condition.  Will get help right away if you are not doing well or get worse.  Your e-visit answers were  reviewed by a board certified advanced clinical practitioner to complete your personal care plan.  Depending on the condition, your plan could have included both over the counter or prescription medications.  If there is a problem please reply once you have received a response from your provider.  Your safety is important to [place].  If you have drug allergies check your prescription carefully.    You can use MyChart to ask questions about today's visit, request a non-urgent call back, or ask for a work or school excuse for 24 hours related to this e-Visit. If it has been greater than 24 hours you will need to follow up with your provider, or enter a new e-Visit to address those concerns. You will get an e-mail in the next two days asking about your experience.  I hope that your e-visit has been valuable and will speed your recovery. Thank you for using e-visits.   Approximately 5 minutes was spent documenting and reviewing patient's chart.

## 2020-03-10 ENCOUNTER — Other Ambulatory Visit: Payer: Self-pay

## 2020-03-10 ENCOUNTER — Encounter (HOSPITAL_COMMUNITY): Payer: Self-pay

## 2020-03-10 ENCOUNTER — Emergency Department (HOSPITAL_COMMUNITY)
Admission: EM | Admit: 2020-03-10 | Discharge: 2020-03-10 | Disposition: A | Discharge status: Home / Self Care | Payer: BC Managed Care – PPO | Attending: Emergency Medicine | Admitting: Emergency Medicine

## 2020-03-10 ENCOUNTER — Emergency Department (HOSPITAL_COMMUNITY): Payer: BC Managed Care – PPO

## 2020-03-10 DIAGNOSIS — Z20822 Contact with and (suspected) exposure to covid-19: Secondary | ICD-10-CM | POA: Diagnosis not present

## 2020-03-10 DIAGNOSIS — J069 Acute upper respiratory infection, unspecified: Secondary | ICD-10-CM | POA: Insufficient documentation

## 2020-03-10 DIAGNOSIS — R059 Cough, unspecified: Secondary | ICD-10-CM | POA: Diagnosis present

## 2020-03-10 DIAGNOSIS — J45909 Unspecified asthma, uncomplicated: Secondary | ICD-10-CM | POA: Insufficient documentation

## 2020-03-10 LAB — CBC
HCT: 42.2 % (ref 36.0–46.0)
Hemoglobin: 13.7 g/dL (ref 12.0–15.0)
MCH: 31.2 pg (ref 26.0–34.0)
MCHC: 32.5 g/dL (ref 30.0–36.0)
MCV: 96.1 fL (ref 80.0–100.0)
Platelets: 278 10*3/uL (ref 150–400)
RBC: 4.39 MIL/uL (ref 3.87–5.11)
RDW: 12.1 % (ref 11.5–15.5)
WBC: 6.2 10*3/uL (ref 4.0–10.5)
nRBC: 0 % (ref 0.0–0.2)

## 2020-03-10 LAB — BASIC METABOLIC PANEL
Anion gap: 10 (ref 5–15)
BUN: 6 mg/dL (ref 6–20)
CO2: 24 mmol/L (ref 22–32)
Calcium: 9.2 mg/dL (ref 8.9–10.3)
Chloride: 104 mmol/L (ref 98–111)
Creatinine, Ser: 0.73 mg/dL (ref 0.44–1.00)
GFR, Estimated: >60 mL/min (ref >60)
Glucose, Bld: 106 mg/dL — ABNORMAL HIGH (ref 70–99)
Potassium: 3.9 mmol/L (ref 3.5–5.1)
Sodium: 138 mmol/L (ref 135–145)

## 2020-03-10 LAB — TROPONIN I (HIGH SENSITIVITY): Troponin I (High Sensitivity): 2 ng/L (ref <18)

## 2020-03-10 LAB — RESPIRATORY PANEL BY RT PCR (FLU A&B, COVID)
Influenza A by PCR: NEGATIVE
Influenza B by PCR: NEGATIVE
SARS Coronavirus 2 by RT PCR: NEGATIVE

## 2020-03-10 LAB — I-STAT BETA HCG BLOOD, ED (MC, WL, AP ONLY): I-stat hCG, quantitative: <5 m[IU]/mL (ref <5)

## 2020-03-10 MED ORDER — ALBUTEROL SULFATE HFA 108 (90 BASE) MCG/ACT IN AERS
6.0000 | INHALATION_SPRAY | Freq: Once | RESPIRATORY_TRACT | Status: AC
  Administered 2020-03-10: 6 via RESPIRATORY_TRACT
  Filled 2020-03-10: qty 6.7

## 2020-03-10 NOTE — Discharge Instructions (Addendum)
While your mild wheezing was suggestive of possible asthma, your other collection of symptoms including headache, productive cough, congestion, and diminished appetite is more suggestive of a viral process.  Your history and physical exam as well as your laboratory work-up and imaging was all quite reassuring.  However, please maintain isolation precautions and results of your COVID-19 testing.  Please follow-up with your family practitioner regarding today's encounter and for ongoing evaluation and management.  Please return to the ED or seek immediate medical attention should you experience any new or worsening symptoms.

## 2020-03-10 NOTE — ED Triage Notes (Signed)
Pt presents with cough, upper chest pain, SOB and headaches x1 week

## 2020-03-10 NOTE — ED Provider Notes (Signed)
[facility] EMERGENCY DEPARTMENT Provider Note   CSN: [id number] Arrival date & time: 03/10/20  7628     History Chief Complaint  Patient presents with   Chest Pain   Cough    Tanya Hayes is a 34 y.o. female with PMH of asthma who presents the ED with a 6-day history of congestion, sore throat, cough productive of yellow phlegm, headaches, diminished appetite, and central chest discomfort worse with cough.  Patient reports that she works at a [employer].  Last week she went to a soccer game at the [employer] [place] [employer] in [place], [place] and was sitting next to somebody who was coughing incessantly and would not wear a mask.  She was concerned that perhaps she was exposed to a viral illness.  She states that she has also noticed that she has been wheezing, consistent with her history of asthma exacerbations.  However, she states that she has not had an exacerbation in years and it was never accompanied by congestion, sore throat, or productive cough.  Patient has tried to treat her symptoms with albuterol 2 puffs every 6 hours, with no significant relief.  Patient denies any fevers or chills, room spinning dizziness, blurred vision, numbness or weakness, hemoptysis, abdominal pain, nausea or vomiting, urinary symptoms, or changes in bowel habits.  Additionally, no recent surgery/hospitalization, history of clots or clotting disorder, unilateral extremity swelling or edema, or hemoptysis.    Patient is fully immunized for COVID-19.  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   Obesity 09/07/2011   Hirsutism 09/07/2011   Acanthosis nigricans 09/07/2011    Past Surgical History:  Procedure Laterality Date   BREAST REDUCTION SURGERY     CLOSED REDUCTION FINGER WITH PERCUTANEOUS PINNING Right 10/26/2017   Procedure: PINNING PIP JOINT;  Surgeon: Cindee Salt, MD;  Location: [facility];  Service: Orthopedics;  Laterality: Right;   REPAIR EXTENSOR TENDON Right 10/26/2017   Procedure: EXTENSOR RETINACULUM GRAFT;  Surgeon: Cindee Salt, MD;  Location: [facility];  Service: Orthopedics;  Laterality: Right;   TRIGGER FINGER RELEASE Right 10/26/2017   Procedure: RECONSTRUCTION A-2 PULLEY RIGHT RING FINGER;  Surgeon: Cindee Salt, MD;  Location:  SURGERY CENTER;  Service: Orthopedics;  Laterality: Right;     OB History    Gravida  2   Para  2   Term  2   Preterm      AB      Living  2     SAB      TAB      Ectopic      Multiple      Live Births              Family History  Problem Relation Age of Onset   Hypertension Maternal Grandmother    Thyroid disease Maternal Grandmother    Stroke Maternal Grandmother    Diabetes Father    Stroke Maternal Grandfather     Social History   Tobacco Use   Smoking status: Never Smoker   Smokeless tobacco: Never  Used  Vaping Use   Vaping Use: Never used  Substance Use Topics   Alcohol use: Yes    Comment: occassionally beer once every 2 weeks or wine less than once a month   Drug use: No    Home Medications Prior to Admission medications   Medication Sig Start Date End Date Taking? Authorizing Provider  albuterol (VENTOLIN HFA) 108 (90 Base) MCG/ACT inhaler Inhale 2 puffs into the lungs every 6 (six) hours as needed for wheezing or shortness of breath. 03/09/20   Junie Spencer, FNP  benzonatate (TESSALON PERLES) 100 MG capsule Take 1 capsule (100 mg total) by mouth 3 (three) times daily as needed. 03/09/20   Jannifer Rodney  A, FNP  dexamethasone (DECADRON) 6 MG tablet Take 1 tablet (6 mg total) by mouth 2 (two) times daily. 03/09/20   Junie Spencer, FNP  Etonogestrel (NEXPLANON [place]) Inject into the skin.    [provider]  HYDROcodone-acetaminophen (NORCO) 5-325 MG tablet Take 1 tablet by mouth every 6 (six) hours as needed. 10/26/17   Cindee Salt, MD  Secukinumab (COSENTYX [place]) Inject into the skin.    [provider]    Allergies    Patient has no known allergies.  Review of Systems   Review of Systems  All other systems reviewed and are negative.   Physical Exam Updated Vital Signs BP 131/74 (BP Location: Right Arm)    Pulse 73    Temp 98.5 F (36.9 C) (Oral)    Resp 16    LMP 12/27/2015    SpO2 97%   Physical Exam Vitals and nursing note reviewed. Exam conducted with a chaperone present.  Constitutional:      Appearance: Normal appearance.  HENT:     Head: Normocephalic and atraumatic.     Mouth/Throat:     Comments: Patent oropharynx.  No tonsillar hypertrophy or exudates noted.  No significant erythema.  No trismus.  Tolerating secretions. Eyes:     General: No scleral icterus.    Conjunctiva/sclera: Conjunctivae normal.  Cardiovascular:     Rate and Rhythm: Normal rate and regular rhythm.  Pulmonary:     Effort: Pulmonary effort is normal. No respiratory distress.     Breath sounds: Wheezing present.     Comments: No significant increased work of breathing or tachypnea noted.  Very mild expiratory wheezing noted on auscultation.  No distress.  No reproducible anterior chest wall TTP.  No overlying skin changes. Musculoskeletal:     Cervical back: Normal range of motion. No rigidity.     Right lower leg: No edema.     Left lower leg: No edema.  Skin:    General: Skin is dry.     Capillary Refill: Capillary refill takes less than 2 seconds.  Neurological:     Mental Status: She is alert and oriented to person, place, and time.     GCS: GCS eye subscore is 4. GCS  verbal subscore is 5. GCS motor subscore is 6.  Psychiatric:        Mood and Affect: Mood normal.        Behavior: Behavior normal.        Thought Content: Thought content normal.     ED Results / Procedures / Treatments   Labs (all labs ordered are listed, but only abnormal results are displayed) Labs Reviewed  BASIC METABOLIC PANEL - Abnormal; Notable for the following components:      Result Value   Glucose, Bld 106 (*)  All other components within normal limits  RESPIRATORY PANEL BY RT PCR (FLU A&B, COVID)  CBC  I-STAT BETA HCG BLOOD, ED ([facility], [facility], [facility] ONLY)  TROPONIN I (HIGH SENSITIVITY)    EKG None  Radiology DG Chest 2 View  Result Date: 03/10/2020 CLINICAL DATA:  Shortness of breath. EXAM: CHEST - 2 VIEW COMPARISON:  01/07/2011. FINDINGS: Mediastinum and hilar structures normal. Lungs are clear. No pleural effusion or pneumothorax. Heart size normal. No acute bony abnormality. IMPRESSION: No acute cardiopulmonary disease. Electronically Signed   By: Maisie Fus  Register   On: 03/10/2020 10:59    Procedures Procedures (including critical care time)  Medications Ordered in ED Medications  albuterol (VENTOLIN HFA) 108 (90 Base) MCG/ACT inhaler 6 puff (has no administration in time range)    ED Course  I have reviewed the triage vital signs and the nursing notes.  Pertinent labs & imaging results that were available during my care of the patient were reviewed by me and considered in my medical decision making (see chart for details).    MDM Rules/Calculators/A&P                          Patient with symptoms consistent with URI for 6 days.  She does have very mild expiratory wheezing, however her other collection of symptoms is more reflective of viral process.  She states that she has had asthma exacerbations in the past and that this feels entirely different.    Labs CBC: No anemia. No leukocytosis concerning for infection. I-STAT beta-hCG: Not pregnant. BMP:  Unremarkable. No electrolyte derangement. Renal function intact. Troponin: 2.  Do not need to trend.  EKG demonstrates normal sinus rhythm with sinus arrhythmia. T wave inversion inferiorly.  I personally reviewed plain films obtained of chest which demonstrates no infiltrate or other acute cardiopulmonary disease.  Symptoms are likely of viral etiology.  Discussed that antibiotics are not indicated for viral infections.  Patient will be discharged with symptomatic treatment.  Patient is tolerating food and liquid without difficulty and I do not believe that laboratory work-up would yield any significant findings.  I emphasized the importance of rest, continued oral hydration, and antipyretics as needed for fever control.    They were provided opportunity to ask any additional questions and have none at this time.  Prior to discharge patient is feeling well, agreeable with plan for discharge home.  They have expressed understanding of verbal discharge instructions as well as return precautions and are agreeable to the plan.    Final Clinical Impression(s) / ED Diagnoses Final diagnoses:  Viral URI with cough    Rx / DC Orders ED Discharge Orders    None       Lorelee New, PA-C 03/10/20 1559    Alvira Monday, MD 03/10/20 2255

## 2020-03-16 ENCOUNTER — Telehealth: Payer: BC Managed Care – PPO | Admitting: Physician Assistant

## 2020-03-16 DIAGNOSIS — R112 Nausea with vomiting, unspecified: Secondary | ICD-10-CM | POA: Diagnosis not present

## 2020-03-16 MED ORDER — PROMETHAZINE HCL 25 MG PO TABS: 25.0000 mg | ORAL_TABLET | Freq: Three times a day (TID) | ORAL | 0 refills | Status: DC | PRN

## 2020-03-16 NOTE — Progress Notes (Signed)

## 2020-05-21 ENCOUNTER — Telehealth: Payer: BC Managed Care – PPO | Admitting: Physician Assistant

## 2020-05-21 DIAGNOSIS — H1032 Unspecified acute conjunctivitis, left eye: Secondary | ICD-10-CM | POA: Diagnosis not present

## 2020-05-21 NOTE — Progress Notes (Signed)
E-Visit for [employer]   We are sorry that you are not feeling well.  Here is how we plan to help!  Based on what you have shared with me it looks like you have conjunctivitis.  Conjunctivitis is a common inflammatory or infectious condition of the eye that is often referred to as "pink eye".  In most cases it is contagious (viral or bacterial). However, not all conjunctivitis requires antibiotics (ex. Allergic).  We have made appropriate suggestions for you based upon your presentation.  Based on what you shared with me, it looks like you have allergic conjunctivitis. Acute allergic conjunctivitis is a sudden-onset reaction that occurs when a person comes in contact with a known allergen, such as cat dander. Symptoms include intense episodes of itching, redness, tearing, and swelling of the eyelid. Symptoms can be severe, although they usually resolve within 24 hours of removal of the allergen.   Basic eye care ?Avoid rubbing the eyes. If itching is bothersome, a cool compress, or antihistamine eye drops. I recommend that you use OpconA, 1-2 drops every 4-6 hours (an over the counter allergy drop available at your local pharmacy).  Your pharmacist may have an alternative suggestion. You can also take an over the counter antihistamine (like Zyrtec, Xyzal, or Allegra) for the next few days.   Wash your hands often!  Do not wear your contacts until your symptoms improve.  See attention for anyone in your home with similar symptoms.   Call us if symptoms are not improved in 1-2 days.  Get Help Right Away If:  Your symptoms do not improve.  You develop blurred or loss of vision.  Your symptoms worsen (increased discharge, pain or redness)  Your e-visit answers were reviewed by a board certified advanced clinical practitioner to complete your personal care plan.  Depending on the condition, your plan could have included both over the counter or prescription medications.  If there is a  problem please reply  once you have received a response from your provider.  Your safety is important to [place].  If you have drug allergies check your prescription carefully.    You can use MyChart to ask questions about today's visit, request a non-urgent call back, or ask for a work or school excuse for 24 hours related to this e-Visit. If it has been greater than 24 hours you will need to follow up with your provider, or enter a new e-Visit to address those concerns.   You will get an e-mail in the next two days asking about your experience.  I hope that your e-visit has been valuable and will speed your recovery. Thank you for using e-visits.    Greater than 5 minutes, yet less than 10 minutes of time have been spent researching, coordinating and implementing care for this patient today.

## 2020-06-09 ENCOUNTER — Telehealth: Payer: BC Managed Care – PPO | Admitting: Emergency Medicine

## 2020-06-09 DIAGNOSIS — L03019 Cellulitis of unspecified finger: Secondary | ICD-10-CM

## 2020-06-09 MED ORDER — DOXYCYCLINE HYCLATE 100 MG PO CAPS: 100.0000 mg | ORAL_CAPSULE | Freq: Two times a day (BID) | ORAL | 0 refills | Status: DC

## 2020-06-09 NOTE — Progress Notes (Signed)
E Visit for Cellulitis  We are sorry that you are not feeling well. Here is how we plan to help!  Based on what you shared with me it looks like you have developing paronychia.  Paronychia's are infections that develop beside the nail, and can go under the nail.  They are commonly caused from injuries around the nail, or carrying or chewing the nails.  Please soak your fingertips in warm water.  You can also try to express some of the pus by pushing on the edge of the skin where it meets the nail.  I have prescribed an antibiotic.  Please take as directed.  If you do not notice any improvement in the next day or 2, you may need to be seen in person and have it drained with a scalpel.  I have prescribed:  Doxcycline 100mg  to be taken twice a day for 10 days.  HOME CARE:  . Take your medications as ordered and take all of them, even if the skin irritation appears to be healing.   GET HELP RIGHT AWAY IF:  . Symptoms that don't begin to go away within 48 hours. . Severe redness persists or worsens . If the area turns color, spreads or swells. . If it blisters and opens, develops yellow-brown crust or bleeds. . You develop a fever or chills. . If the pain increases or becomes unbearable.  . Are unable to keep fluids and food down.  MAKE SURE YOU    Understand these instructions.  Will watch your condition.  Will get help right away if you are not doing well or get worse.  Thank you for choosing an e-visit. Your e-visit answers were reviewed by a board certified advanced clinical practitioner to complete your personal care plan. Depending upon the condition, your plan could have included both over the counter or prescription medications. Please review your pharmacy choice. Make sure the pharmacy is open so you can pick up prescription now. If there is a problem, you may contact your provider through and have the prescription routed to another pharmacy. Your safety is  important to [employer]. If you have drug allergies check your prescription carefully.  For the next 24 hours you can use MyChart to ask questions about today's visit, request a non-urgent call back, or ask for a work or school excuse. You will get an email in the next two days asking about your experience. I hope that your e-visit has been valuable and will speed your recovery.  Approximately 5 minutes was used in reviewing the patient's chart, questionnaire, prescribing medications, and documentation.

## 2020-06-27 ENCOUNTER — Telehealth: Payer: BC Managed Care – PPO | Admitting: Physician Assistant

## 2020-06-27 DIAGNOSIS — H5711 Ocular pain, right eye: Secondary | ICD-10-CM

## 2020-06-27 DIAGNOSIS — S0591XA Unspecified injury of right eye and orbit, initial encounter: Secondary | ICD-10-CM

## 2020-06-27 NOTE — Progress Notes (Signed)
Based on what you shared with me, I feel your condition warrants further evaluation and I recommend that you be seen for a face to face office visit.   NOTE: If you entered your credit card information for this eVisit, you will not be charged. You may see a "hold" on your card for the $35 but that hold will drop off and you will not have a charge processed.  Ms. Tanya Hayes to the injury to the eye and continued pain, I would recommend that you have a face to face evaluation of your symptoms    If you are having a true medical emergency please call 911.      For an urgent face to face visit, [place] has five urgent care centers for your convenience:     [facility] at [facility] Directions [phone] [address] [place], [place] [address] . 10 am - 6pm Monday - Friday    [facility] Urgent Care Center [facility]) Get Driving Directions [phone] [address] [place], [place] [address] . 10 am to 8 pm Monday-Friday . 12 pm to 8 pm [facility] Urgent Care at [facility] Get Driving Directions [phone] [address] [place], [place] [address] . 8 am to 8 pm Monday-Friday . 9 am to 6 pm Saturday . 11 am to 6 pm Sunday     [facility] Ur[facility] Care at [facility] Get Driving Directions  [phone] [address] [place], [place] [address] . 8 am to 8 pm Monday-Friday . 8 am to 4 pm [facility] Urgent Care at [facility] Directions [phone] [address] [place], [place] [address] . 12 pm to 6 pm Monday-Friday      Your e-visit answers were reviewed by a board certified advanced clinical practitioner to complete your personal care plan.  Thank you for using e-Visits.    I spent 5-10 minutes on review and completion of this note- [facility]

## 2020-08-03 ENCOUNTER — Telehealth: Payer: BC Managed Care – PPO | Admitting: Emergency Medicine

## 2020-08-03 DIAGNOSIS — H9201 Otalgia, right ear: Secondary | ICD-10-CM

## 2020-08-03 DIAGNOSIS — R42 Dizziness and giddiness: Secondary | ICD-10-CM

## 2020-08-03 NOTE — Progress Notes (Signed)
Your symptoms are NOT consistent with Swimmer's Ear (or external ear infection).  Your symptoms could be consistent with an inner ear infection, but could also be indicative of labyrinthitis.  Based on what you shared with me, I feel your condition warrants further evaluation and I recommend that you be seen for a face to face office visit.  You will need to have a formal otoscopic ear exam to properly diagnose your condition.   NOTE: If you entered your credit card information for this eVisit, you will not be charged. You may see a "hold" on your card for the $35 but that hold will drop off and you will not have a charge processed.   If you are having a true medical emergency please call 911.      For an urgent face to face visit, [place] has five urgent care centers for your convenience:     [facility] Ur[facility] Care Center at [facility] Directions [phone] [address] [place], [place] [address] . 10 am - 6pm Monday - Friday    [facility] Health Urgent Care Center [facility]) Get Driving Directions [phone] [address] [place], [place] [address] . 10 am to 8 pm Monday-Friday . 12 pm to 8 pm [facility] Urgent Care at [facility] Get Driving Directions [phone] [address] [place], [place] [address] . 8 am to 8 pm Monday-Friday . 9 am to 6 pm Saturday . 11 am to 6 pm Sunday     [facility] Urgent Care at [facility] Get Driving Directions  [phone] [address] [place], [place] [address] . 8 am to 8 pm Monday-Friday . 8 am to 4 pm [facility] Urgent Care at [facility] Directions [phone] [address] [place], [place] [address] . 12 pm to 6 pm Monday-Friday      Your e-visit answers were reviewed by a board certified advanced clinical practitioner to complete your personal care plan.  Thank you for using e-Visits.     Approximately 5 minutes was used in reviewing the patient's chart, questionnaire, prescribing medications, and documentation.

## 2021-01-25 ENCOUNTER — Telehealth: Payer: BC Managed Care – PPO | Admitting: Physician Assistant

## 2021-01-25 DIAGNOSIS — H1012 Acute atopic conjunctivitis, left eye: Secondary | ICD-10-CM

## 2021-01-25 DIAGNOSIS — J301 Allergic rhinitis due to pollen: Secondary | ICD-10-CM

## 2021-01-25 MED ORDER — OLOPATADINE HCL 0.2 % OP SOLN: 1.0000 [drp] | Freq: Every day | OPHTHALMIC | 0 refills | Status: DC

## 2021-01-25 MED ORDER — FEXOFENADINE HCL 60 MG PO TABS: 60.0000 mg | ORAL_TABLET | Freq: Two times a day (BID) | ORAL | 0 refills | Status: AC

## 2021-01-25 NOTE — Progress Notes (Signed)
E visit for Allergic Rhinitis We are sorry that you are not feeling well.  Here is how we plan to help!  Based on what you have shared with me it looks like you have Allergic Rhinitis.  Rhinitis is when a reaction occurs that causes nasal congestion, runny nose, sneezing, and itching.  Most types of rhinitis are caused by an inflammation and are associated with symptoms in the eyes ears or throat. There are several types of rhinitis.  The most common are acute rhinitis, which is usually caused by a viral illness, allergic or seasonal rhinitis, and nonallergic or year-round rhinitis.  Nasal allergies occur certain times of the year.  Allergic rhinitis is caused when allergens in the air trigger the release of histamine in the body.  Histamine causes itching, swelling, and fluid to build up in the fragile linings of the nasal passages, sinuses and eyelids.  An itchy nose and clear discharge are common.  I recommend the following treatments: Allegra 60 mg twice daily   I also would recommend a nasal spray: Continue nasacort and saline nasal rinses  You may also benefit from eye drops such as: Pataday eye drops  HOME CARE:  You can use an over-the-counter saline nasal spray as needed Avoid areas where there is heavy dust, mites, or molds Stay indoors on windy days during the pollen season Keep windows closed in home, at least in bedroom; use air conditioner. Use high-efficiency house air filter Keep windows closed in car, turn AC on re-circulate Avoid playing out with dog during pollen season  GET HELP RIGHT AWAY IF:  If your symptoms do not improve within 10 days You become short of breath You develop yellow or green discharge from your nose for over 3 days You have coughing fits  MAKE SURE YOU:  Understand these instructions Will watch your condition Will get help right away if you are not doing well or get worse  Thank you for choosing an e-visit. Your e-visit answers were  reviewed by a board certified advanced clinical practitioner to complete your personal care plan. Depending upon the condition, your plan could have included both over the counter or prescription medications. Please review your pharmacy choice. Be sure that the pharmacy you have chosen is open so that you can pick up your prescription now.  If there is a problem you may message your provider in MyChart to have the prescription routed to another pharmacy. Your safety is important to [place]. If you have drug allergies check your prescription carefully.  For the next 24 hours, you can use MyChart to ask questions about today's visit, request a non-urgent call back, or ask for a work or school excuse from your e-visit provider. You will get an email in the next two days asking about your experience. I hope that your e-visit has been valuable and will speed your recovery.   I provided 6 minutes of non face-to-face time during this encounter for chart review and documentation.

## 2021-02-02 IMAGING — CR DG CHEST 2V
2 series · 2 of 2 positions shown · non-contrast
Comparison: 01/07/2011.

CLINICAL DATA: Shortness of breath.

EXAM:
CHEST - 2 VIEW

[chest pa]
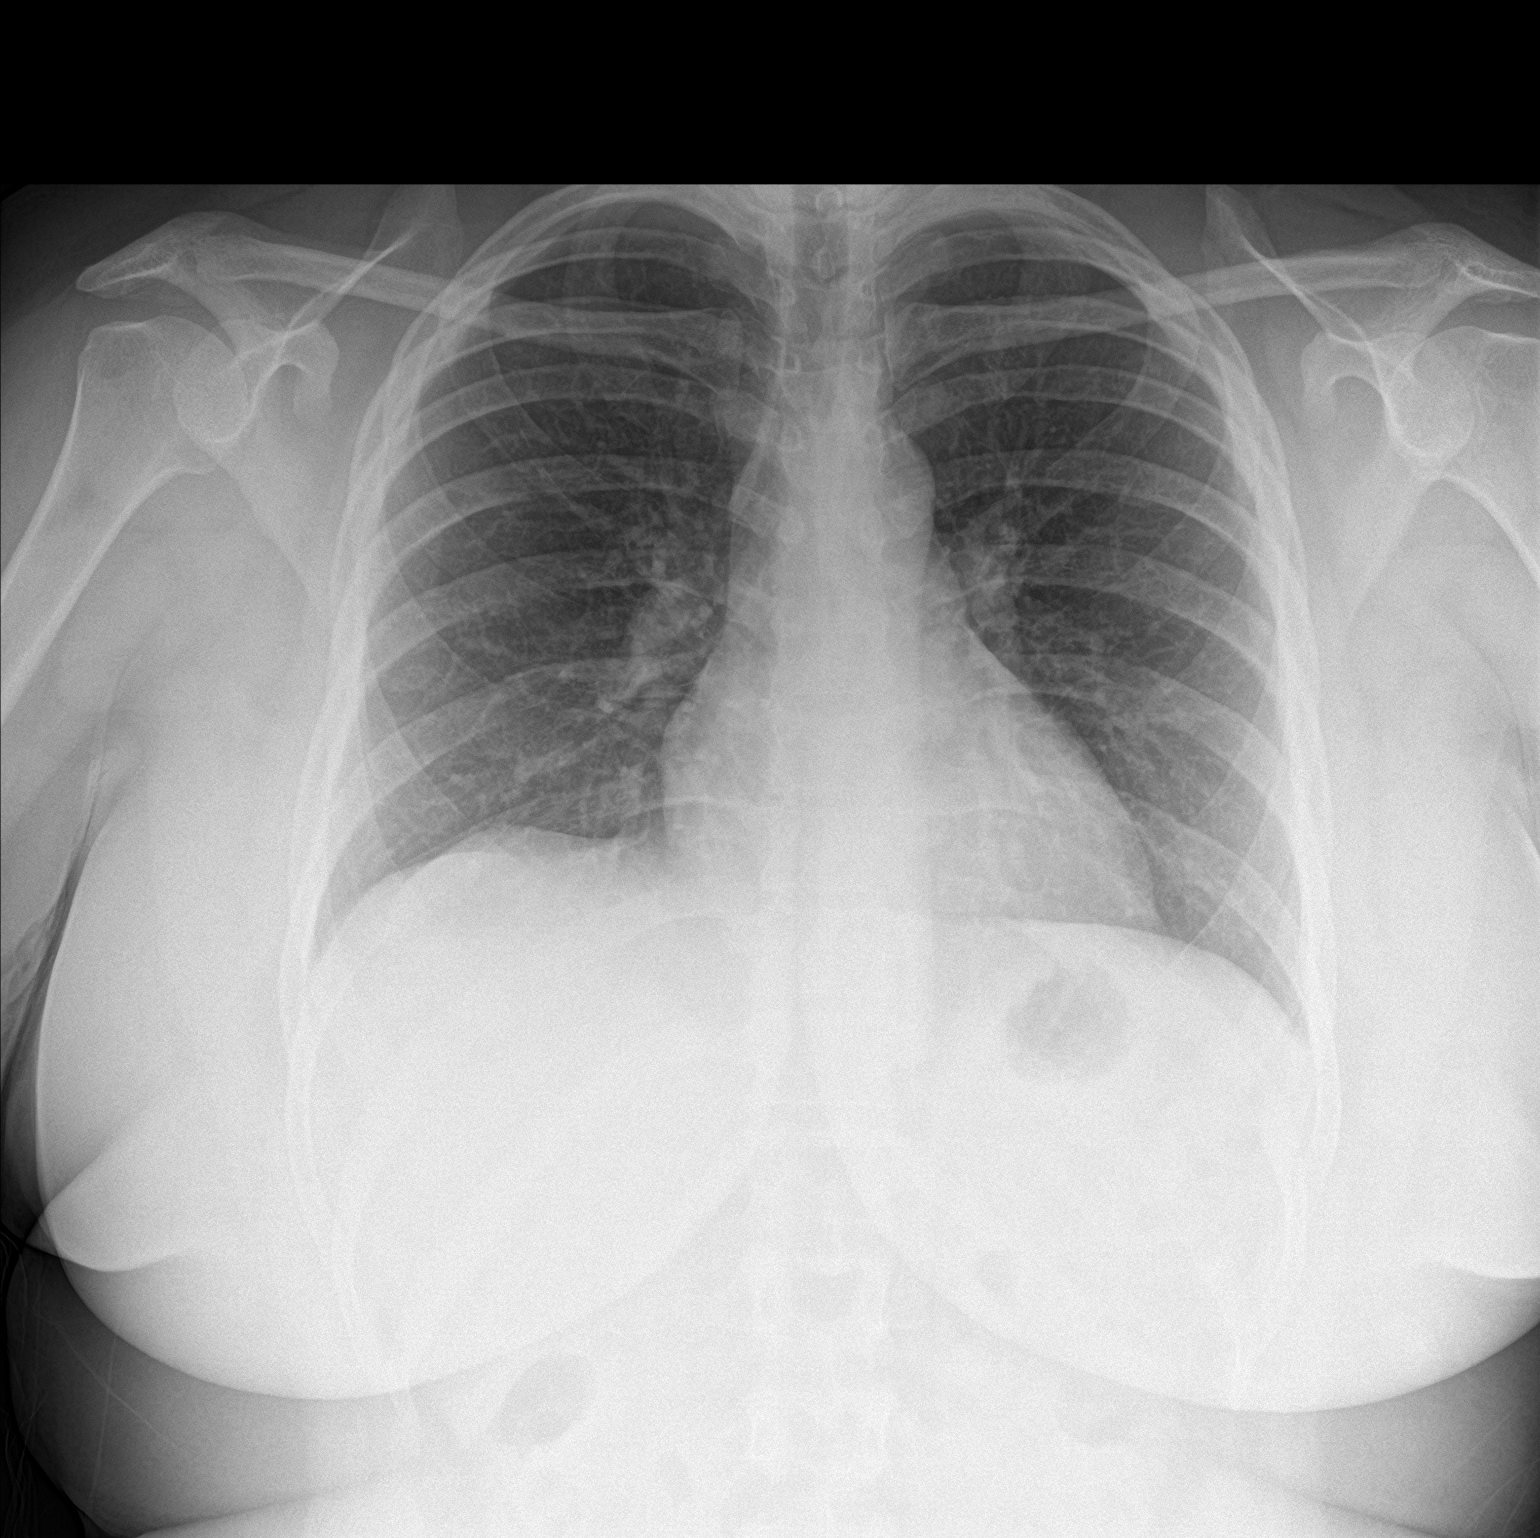

[chest lat]
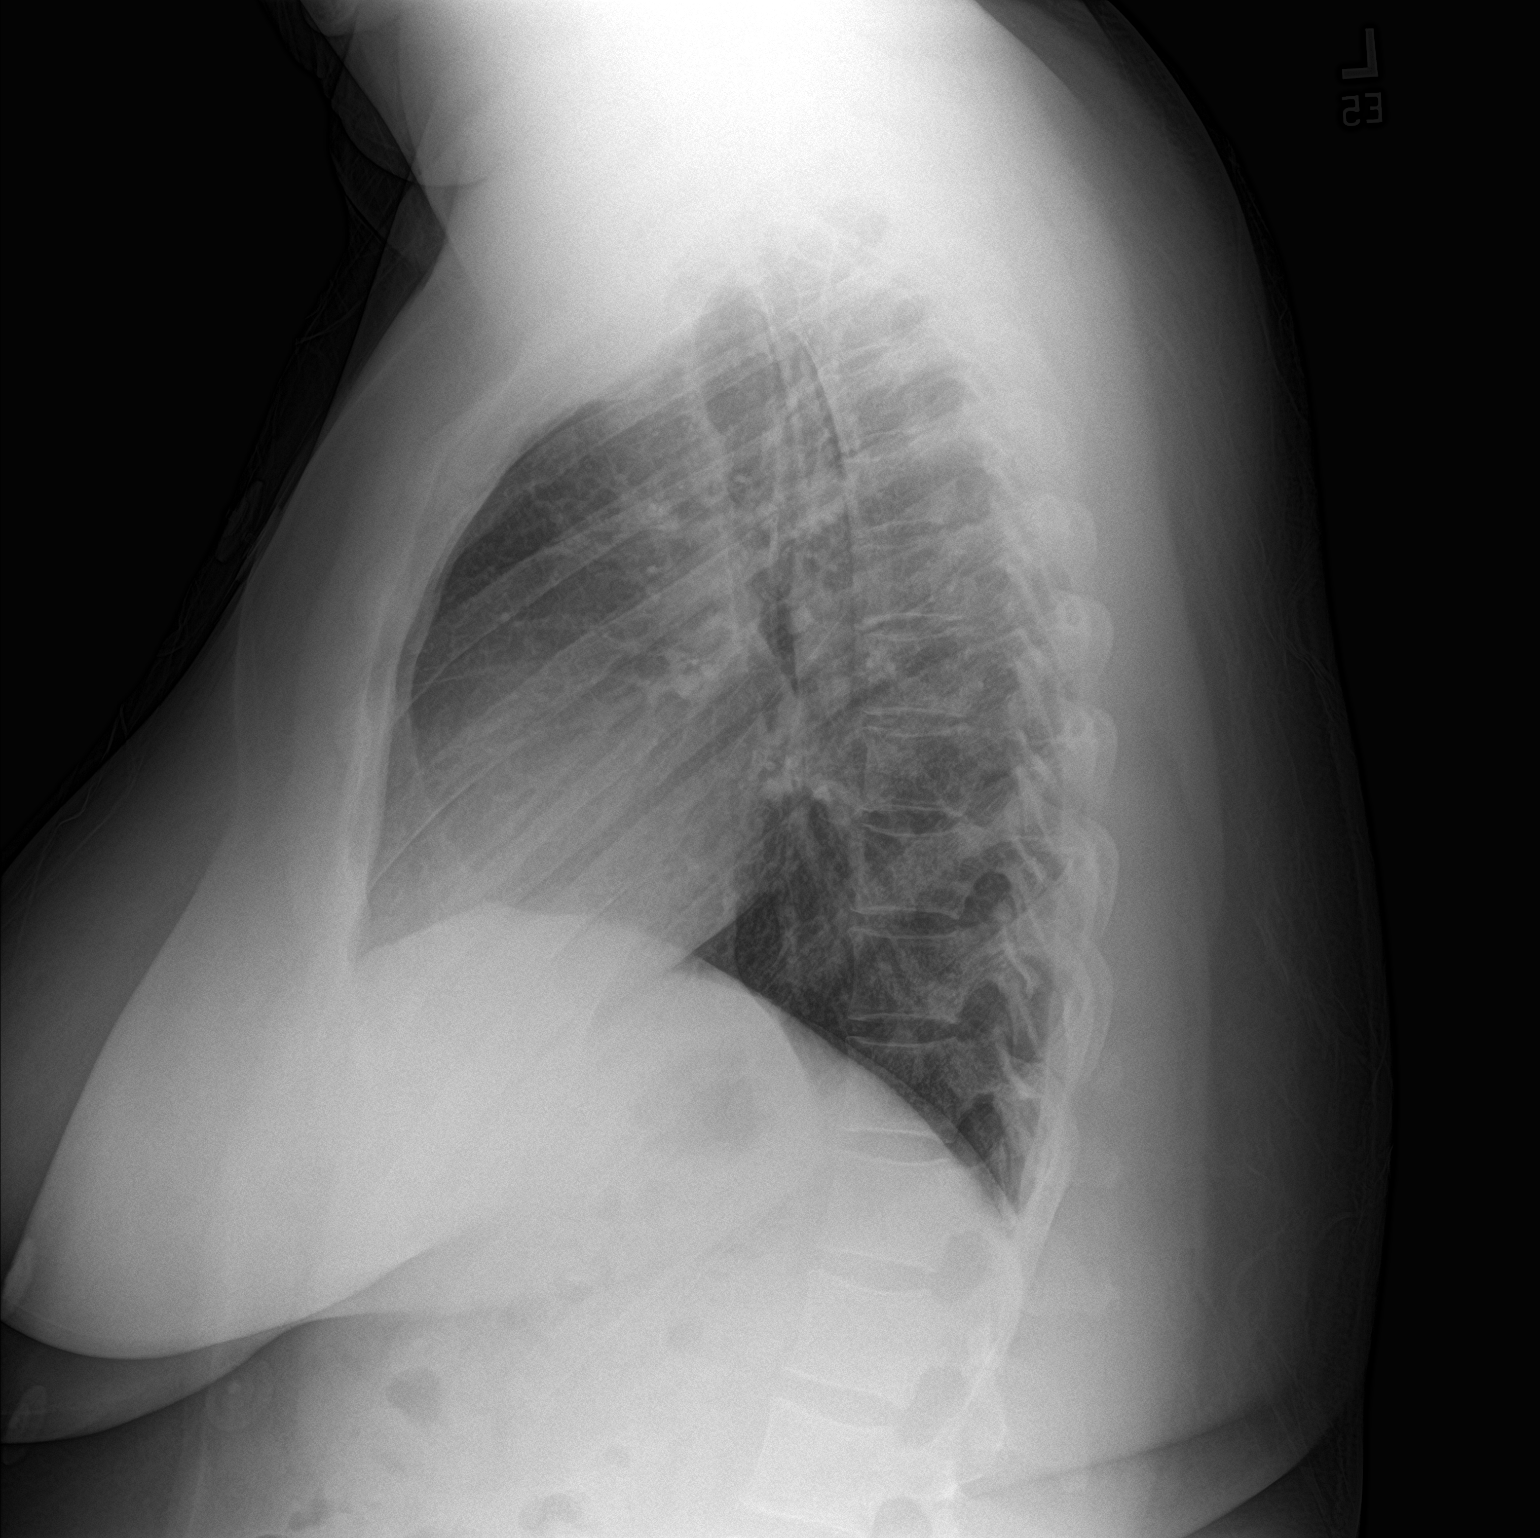

[2 of 2 positions shown; findings below may reference images not displayed]

FINDINGS: Mediastinum and hilar structures normal. Lungs are clear. No pleural
effusion or pneumothorax. Heart size normal. No acute bony
abnormality.
IMPRESSION: No acute cardiopulmonary disease.

## 2021-03-09 NOTE — Progress Notes (Deleted)
NEW PATIENT Date of Service/Encounter:  03/09/21 Referring provider: Ollen Bowl, MD Primary care provider: Patient, No Pcp Per (Inactive)  Subjective:  Tanya Hayes is a 35 y.o. female with a PMHx of hirsutism, migraines, PCOS, acanthosis nigrans, hypertriglyceridemia presenting today for evaluation of chronic rhinitis. History obtained from: chart review and {Persons; PED relatives w/patient:19415::"patient"}.   Chronic rhinitis-  Other allergy screening: Asthma: {Blank single:19197::"yes","no"} Rhino conjunctivitis: {Blank single:19197::"yes","no"} Food allergy: {Blank single:19197::"yes","no"} Medication allergy: {Blank single:19197::"yes","no"} Hymenoptera allergy: {Blank single:19197::"yes","no"} Urticaria: {Blank single:19197::"yes","no"} Eczema:{Blank single:19197::"yes","no"} History of recurrent infections suggestive of immunodeficency: {Blank single:19197::"yes","no"} ***Vaccinations are up to date.   Past Medical History: Past Medical History:  Diagnosis Date   Asthma    Obesity    PCOS (polycystic ovarian syndrome)    Medication List:  Current Outpatient Medications  Medication Sig Dispense Refill   Etonogestrel (NEXPLANON [place]) Inject into the skin.     fexofenadine (ALLEGRA ALLERGY) 60 MG tablet Take 1 tablet (60 mg total) by mouth 2 (two) times daily. 30 tablet 0   Olopatadine HCl 0.2 % SOLN Apply 1 drop to eye daily. 2.5 mL 0   Secukinumab (COSENTYX [place]) Inject into the skin.     No current facility-administered medications for this visit.   Known Allergies:  No Known Allergies Past Surgical History: Past Surgical History:  Procedure Laterality Date   BREAST REDUCTION SURGERY     CLOSED REDUCTION FINGER WITH PERCUTANEOUS PINNING Right 10/26/2017   Procedure: PINNING PIP JOINT;  Surgeon: Cindee Salt, MD;  Location: [facility];  Service: Orthopedics;  Laterality: Right;   REPAIR EXTENSOR TENDON Right 10/26/2017   Procedure:  EXTENSOR RETINACULUM GRAFT;  Surgeon: Cindee Salt, MD;  Location: [facility];  Service: Orthopedics;  Laterality: Right;   TRIGGER FINGER RELEASE Right 10/26/2017   Procedure: RECONSTRUCTION A-2 PULLEY RIGHT RING FINGER;  Surgeon: Cindee Salt, MD;  Location: [facility];  Service: Orthopedics;  Laterality: Right;   Family History: Family History  Problem Relation Age of Onset   Hypertension Maternal Grandmother    Thyroid disease Maternal Grandmother    Stroke Maternal Grandmother    Diabetes Father    Stroke Maternal Grandfather    Social History: Tanya Hayes lives ***.   ROS:  All other systems negative except as noted per HPI.  Objective:  There were no vitals taken for this visit. There is no height or weight on file to calculate BMI. Physical Exam:  General Appearance:  Alert, cooperative, no distress, appears stated age  Head:  Normocephalic, without obvious abnormality, atraumatic  Eyes:  Conjunctiva clear, EOM's intact  Nose: Nares normal  Throat: Lips, tongue normal; teeth and gums normal  Neck: Supple, symmetrical  Lungs:   Respirations unlabored, no coughing  Heart:  Appears well perfused  Extremities: No edema  Skin: Skin color, texture, turgor normal, no rashes or lesions on visualized portions of skin  Neurologic: No Hayes deficits   Diagnostics: Spirometry:  Tracings reviewed. Her effort: {Blank single:19197::"Good reproducible efforts.","It was hard to get consistent efforts and there is a question as to whether this reflects a maximal maneuver.","Poor effort, data can not be interpreted."} FVC: ***L FEV1: ***L, ***% predicted FEV1/FVC ratio: ***% Interpretation: {Blank single:19197::"Spirometry consistent with mild obstructive disease","Spirometry consistent with moderate obstructive disease","Spirometry consistent with severe obstructive disease","Spirometry consistent with possible restrictive disease","Spirometry consistent with  mixed obstructive and restrictive disease","Spirometry uninterpretable due to technique","Spirometry consistent with normal pattern","No overt abnormalities noted given today's efforts"}.  Please  see scanned spirometry results for details.  Skin Testing: {Blank single:19197::"Select foods","Environmental allergy panel","Environmental allergy panel and select foods","Food allergy panel","None","Deferred due to recent antihistamines use"}. Positive test to: ***. Negative test to: ***.  Results discussed with patient/family.   {Blank single:19197::"Allergy testing results were read and interpreted by myself, documented by clinical staff."," "}  Assessment and Plan  There are no Patient Instructions on file for this visit.  No follow-ups on file.  {Blank single:19197::"This note in its entirety was forwarded to the Provider who requested this consultation."}  Thank you for your kind referral. I appreciate the opportunity to take part in Tanya Hayes's care. Please do not hesitate to contact me with questions.***  Sincerely,  Tonny Bollman, MD [facility]

## 2021-03-10 ENCOUNTER — Ambulatory Visit (INDEPENDENT_AMBULATORY_CARE_PROVIDER_SITE_OTHER): Payer: 59 | Admitting: Internal Medicine

## 2021-03-10 ENCOUNTER — Ambulatory Visit: Payer: Self-pay | Admitting: Internal Medicine

## 2021-03-10 ENCOUNTER — Encounter: Payer: Self-pay | Admitting: Internal Medicine

## 2021-03-10 ENCOUNTER — Other Ambulatory Visit: Payer: Self-pay

## 2021-03-10 VITALS — BP 124/82 | HR 97 | Temp 98.1°F | Resp 16 | Ht 65.0 in | Wt 237.4 lb

## 2021-03-10 DIAGNOSIS — H1045 Other chronic allergic conjunctivitis: Secondary | ICD-10-CM

## 2021-03-10 DIAGNOSIS — J3089 Other allergic rhinitis: Secondary | ICD-10-CM | POA: Diagnosis not present

## 2021-03-10 DIAGNOSIS — J4520 Mild intermittent asthma, uncomplicated: Secondary | ICD-10-CM | POA: Diagnosis not present

## 2021-03-10 DIAGNOSIS — H109 Unspecified conjunctivitis: Secondary | ICD-10-CM | POA: Insufficient documentation

## 2021-03-10 NOTE — Patient Instructions (Addendum)
Exercise Induced Asthma: - your breathing test today looked great but did show significant reversibility with albuterol which confirms diagnosis of asthma - Use Albuterol (Proair/Ventolin) 2 puffs every 4-6 hours as needed for chest tightness, wheezing, or coughing - Use Albuterol (Proair/Ventolin) 2 puffs 15 minutes prior to exercise if you have symptoms with activity - Use a spacer with all inhalers - please keep track of how often you are needing rescue inhaler Albuterol (Proair/Ventolin) as this will help guide future management - Asthma is not controlled if:  - Symptoms are occurring >2 times a week OR  - >2 times a month nighttime awakenings  - Please call the clinic to schedule a follow up if these symptoms arise  Chronic Rhinitis: perennial allergic rhinitis: - allergy testing today was positive to dust mites and cats - allergen avoidance as below - consider allergy shots as long term control of your symptoms by teaching your immune system to be more tolerant of your allergy triggers - Start Nasal Steroid Spray: Options include Flonase (fluticasone), Nasocort (triamcinolone), Nasonex (mometasome) 1- 2 sprays in each nostril daily (can buy over-the-counter if not covered by insurance)  Best results if used daily. - Continue Pataday (Olopatadine) for eye symptoms daily as needed -Avoid eye drops that say red eye relief as they may contain medications that dry out your eyes. - Continue over the counter antihistamine daily or daily as needed.  Your options include Zyrtec (Cetirizine) 10mg , Claritin (Loratadine) 10mg , Allegra (Fexofenadine) 180mg , or Xyzal (Levocetirinze) 5mg   Follow-up in 3 months. ---------------------------------------------------------- Control of Dog or Cat Allergen  Avoidance is the best way to manage a dog or cat allergy. If you have a dog or cat and are allergic to dog or cats, consider removing the dog or cat from the home. If you have a dog or cat but don't want  to find it a new home, or if your family wants a pet even though someone in the household is allergic, here are some strategies that may help keep symptoms at bay:  Keep the pet out of your bedroom and restrict it to only a few rooms. Be advised that keeping the dog or cat in only one room will not limit the allergens to that room. Don't pet, hug or kiss the dog or cat; if you do, wash your hands with soap and water. High-efficiency particulate air (HEPA) cleaners run continuously in a bedroom or living room can reduce allergen levels over time. Regular use of a high-efficiency vacuum cleaner or a central vacuum can reduce allergen levels. Giving your dog or cat a bath at least once a week can reduce airborne allergen.  DUST MITE AVOIDANCE MEASURES:  There are three main measures that need and can be taken to avoid house dust mites:  Reduce accumulation of dust in general -reduce furniture, clothing, carpeting, books, stuffed animals, especially in bedroom  Separate yourself from the dust -use pillow and mattress encasements (can be found at stores such as [employer] or online) -avoid direct exposure to air condition flow -use a HEPA filter device, especially in the bedroom; you can also use a HEPA filter vacuum cleaner -wipe dust with a moist towel instead of a dry towel or broom when cleaning  Decrease mites and/or their secretions -wash clothing and linen and stuffed animals at highest temperature possible, at least every 2 weeks -stuffed animals can also be placed in a bag and put in a freezer overnight  Despite the above measures,  it is impossible to eliminate dust mites or their allergen completely from your home.  With the above measures the burden of mites in your home can be diminished, with the goal of minimizing your allergic symptoms.  Success will be reached only when implementing and using all means together.

## 2021-03-10 NOTE — Progress Notes (Signed)
NEW PATIENT Date of Service/Encounter:  03/10/21 Referring provider: Mckinley Jewel, MD Primary care provider: Mckinley Jewel, MD  Subjective:  Denton Ar is a 35 y.o. female with a PMHx of psoriasis, OSA, obesity, PCOS presenting today for evaluation of chronic rhinitis and asthma. History obtained from: chart review and patient.   Chronic rhinitis:  + red itchy eyes, itching on face, sinus pressure with headaches, rhinorrhea, congestion Occurs randomly and will have flares that last 2-3 days Worse in summer, but does happen year round Taking zyrtec daily as needed, occasionally uses eye drops Never allergy tested.  Asthma:  She has a rescue inhaler, but uses infrequently (not every month, maybe 3 times per year) Triggers for her are heavy exercise Diagnosed as a 35 year old girl Never been on a controller inhaler. Never hospitalized, never intubated. No ED/UC visits for asthma.  No systemic steroids in past year.  She does have psoriasis on cosentyx.   Other allergy screening: Food allergy: no Medication allergy: no Hymenoptera allergy: no Urticaria: no Eczema:no History of recurrent infections suggestive of immunodeficency: no Vaccinations are up to date.   Past Medical History: Past Medical History:  Diagnosis Date   Asthma    Obesity    PCOS (polycystic ovarian syndrome)    Psoriasis    Medication List:  Current Outpatient Medications  Medication Sig Dispense Refill   cetirizine (ZYRTEC) 10 MG tablet Take 10 mg by mouth daily.     Etonogestrel (NEXPLANON [place]) Inject into the skin.     fexofenadine (ALLEGRA ALLERGY) 60 MG tablet Take 1 tablet (60 mg total) by mouth 2 (two) times daily. 30 tablet 0   Olopatadine HCl 0.2 % SOLN Apply 1 drop to eye daily. 2.5 mL 0   Secukinumab (COSENTYX [place]) Inject into the skin.     No current facility-administered medications for this visit.   Known Allergies:  No Known Allergies Past Surgical  History: Past Surgical History:  Procedure Laterality Date   BREAST REDUCTION SURGERY     CLOSED REDUCTION FINGER WITH PERCUTANEOUS PINNING Right 10/26/2017   Procedure: PINNING PIP JOINT;  Surgeon: Daryll Brod, MD;  Location: [place];  Service: Orthopedics;  Laterality: Right;   REPAIR EXTENSOR TENDON Right 10/26/2017   Procedure: EXTENSOR RETINACULUM GRAFT;  Surgeon: Daryll Brod, MD;  Location: [place];  Service: Orthopedics;  Laterality: Right;   TRIGGER FINGER RELEASE Right 10/26/2017   Procedure: RECONSTRUCTION A-2 PULLEY RIGHT RING FINGER;  Surgeon: Daryll Brod, MD;  Location: [place];  Service: Orthopedics;  Laterality: Right;   Family History: Family History  Problem Relation Age of Onset   Hypertension Maternal Grandmother    Thyroid disease Maternal Grandmother    Stroke Maternal Grandmother    Diabetes Father    Stroke Maternal Grandfather    Social History: Vernetta lives in a home built 11 years ago, Charity fundraiser, Psychologist, clinical, central AC, pets: dog, cat, birds, fish, gecko, no pests, dust mite protection on mattress not pillow, no tobacco exposure, works as Radio broadcast assistant x 7 years, no HEPA filter in home.   ROS:  All other systems negative except as noted per HPI.  Objective:  Blood pressure 124/82, pulse 97, temperature 98.1 F (36.7 C), resp. rate 16, height 5\' 5"  (1.651 m), weight 237 lb 6.4 oz (107.7 kg), SpO2 98 %. Body mass index is 39.51 kg/m. Physical Exam:  General Appearance:  Alert, cooperative, no distress, appears stated age  Head:  Normocephalic, without obvious abnormality, atraumatic  Eyes:  Conjunctiva clear, EOM's intact  Nose: Nares normal, hypertophic pink turbinates  Throat: Lips, tongue normal; teeth and gums normal, normal posterior oropharynx  Neck: Supple, symmetrical  Lungs:   CTAB, Respirations unlabored, no coughing  Heart:  RRR, no murmur, Appears well perfused  Extremities: No edema  Skin: Skin  color, texture, turgor normal, no rashes or lesions on visualized portions of skin  Neurologic: No gross deficits   Diagnostics: Spirometry:  Tracings reviewed. Her effort: Good reproducible efforts. FVC: 3.0L (pre), 3.26L  (post) FEV1: 2.32L, 82% predicted (pre), 2.59L, 91% predicted (post), +12% FEV1/FVC ratio: 91% (pre), 93% (post) Interpretation: Spirometry consistent with normal pattern with significant bronchodilator response   Skin Testing: Environmental allergy panel. Adequate positive and negative controls. Results discussed with patient/family.  Airborne Adult Perc - 03/10/21 1010     Time Antigen Placed 0100    Allergen Manufacturer Waynette Buttery    Location Back    Number of Test 59    1. Control-Buffer 50% Glycerol Negative    2. Control-Histamine 1 mg/ml 3+    3. Albumin saline Negative    4. Bahia Negative    5. French Southern Territories Negative    6. Johnson Negative    7. Kentucky Blue Negative    8. Meadow Fescue Negative    9. Perennial Rye Negative    10. Sweet Vernal Negative    11. Timothy Negative    12. Cocklebur Negative    13. Burweed Marshelder Negative    14. Ragweed, short Negative    15. Ragweed, Giant Negative    16. Plantain,  English Negative    17. Lamb's Quarters Negative    18. Sheep Sorrell Negative    19. Rough Pigweed Negative    20. Marsh Elder, Rough Negative    21. Mugwort, Common Negative    22. Ash mix Negative    23. Birch mix Negative    24. Beech American Negative    25. Box, Elder Negative    26. Cedar, red Negative    27. Cottonwood, Guinea-Bissau Negative    28. Elm mix Negative    29. Hickory Negative    30. Maple mix Negative    31. Oak, Guinea-Bissau mix Negative    32. Pecan Pollen Negative    33. Pine mix Negative    34. Sycamore Eastern Negative    35. Walnut, Black Pollen Negative    36. Alternaria alternata Negative    37. Cladosporium Herbarum Negative    38. Aspergillus mix Negative    39. Penicillium mix Negative    40. Bipolaris  sorokiniana (Helminthosporium) Negative    41. Drechslera spicifera (Curvularia) Negative    42. Mucor plumbeus Negative    43. Fusarium moniliforme Negative    44. Aureobasidium pullulans (pullulara) Negative    45. Rhizopus oryzae Negative    46. Botrytis cinera Negative    47. Epicoccum nigrum Negative    48. Phoma betae Negative    49. Candida Albicans Negative    50. Trichophyton mentagrophytes Negative    51. Mite, D Farinae  5,000 AU/ml 4+    52. Mite, D Pteronyssinus  5,000 AU/ml 4+    53. Cat Hair 10,000 BAU/ml Negative    54.  Dog Epithelia Negative    55. Mixed Feathers Negative    56. Horse Epithelia Negative    57. Cockroach, German Negative    58. Mouse Negative    59. Tobacco Leaf Negative  Intradermal - 03/10/21 1058     Time Antigen Placed 1030    Allergen Manufacturer [place]    Location Arm    Number of Test 14    Control Negative    Guatemala Negative    Johnson Negative    7 Grass Negative    Ragweed mix Negative    Weed mix Negative    Tree mix Negative    Mold 1 Negative    Mold 2 Negative    Mold 3 Negative    Mold 4 Negative    Cat 4+    Dog Negative    Cockroach Negative             Allergy testing results were read and interpreted by myself, documented by clinical staff.  Assessment and Plan   Patient Instructions  Exercise Induced Asthma-controlled: - your breathing test today looked great but did show significant reversibility with albuterol which confirms diagnosis of asthma - Use Albuterol (Proair/Ventolin) 2 puffs every 4-6 hours as needed for chest tightness, wheezing, or coughing - Use Albuterol (Proair/Ventolin) 2 puffs 15 minutes prior to exercise if you have symptoms with activity - Use a spacer with all inhalers - please keep track of how often you are needing rescue inhaler Albuterol (Proair/Ventolin) as this will help guide future management - Asthma is not controlled if:  - Symptoms are occurring >2 times  a week OR  - >2 times a month nighttime awakenings  - Please call the clinic to schedule a follow up if these symptoms arise  Chronic Rhinitis: perennial allergic rhinitis and conjunctivitis-uncontrolled: - allergy testing today was positive to dust mites and cats - allergen avoidance as below - consider allergy shots as long term control of your symptoms by teaching your immune system to be more tolerant of your allergy triggers - Start Nasal Steroid Spray: Options include Flonase (fluticasone), Nasocort (triamcinolone), Nasonex (mometasome) 1- 2 sprays in each nostril daily (can buy over-the-counter if not covered by insurance)  Best results if used daily. - Continue Pataday (Olopatadine) for eye symptoms daily as needed -Avoid eye drops that say red eye relief as they may contain medications that dry out your eyes. - Continue over the counter antihistamine daily or daily as needed.  Your options include Zyrtec (Cetirizine) 10mg , Claritin (Loratadine) 10mg , Allegra (Fexofenadine) 180mg , or Xyzal (Levocetirinze) 5mg   Follow-up in 3 months.  This note in its entirety was forwarded to the Provider who requested this consultation.  Thank you for your kind referral. I appreciate the opportunity to take part in Maurianna's care. Please do not hesitate to contact me with questions.  Sincerely,  Sigurd Sos, MD [facility]

## 2021-03-18 ENCOUNTER — Ambulatory Visit: Payer: Self-pay | Admitting: Internal Medicine

## 2021-05-01 ENCOUNTER — Telehealth: Payer: 59 | Admitting: Nurse Practitioner

## 2021-05-01 DIAGNOSIS — J4531 Mild persistent asthma with (acute) exacerbation: Secondary | ICD-10-CM | POA: Diagnosis not present

## 2021-05-01 MED ORDER — PREDNISONE 20 MG PO TABS: 40.0000 mg | ORAL_TABLET | Freq: Every day | ORAL | 0 refills | Status: AC

## 2021-05-01 MED ORDER — ALBUTEROL SULFATE HFA 108 (90 BASE) MCG/ACT IN AERS: 2.0000 | INHALATION_SPRAY | Freq: Four times a day (QID) | RESPIRATORY_TRACT | 2 refills | Status: AC | PRN

## 2021-05-01 NOTE — Progress Notes (Signed)
Visit for Asthma  Based on what you have shared with me, it looks like you may have a flare up of your asthma.  Asthma is a chronic (ongoing) lung disease which results in airway obstruction, inflammation and hyper-responsiveness.   Asthma symptoms vary from person to person, with common symptoms including nighttime awakening and decreased ability to participate in normal activities as a result of shortness of breath. It is often triggered by changes in weather, changes in the season, changes in air temperature, or inside (home, school, daycare or work) allergens such as animal dander, mold, mildew, woodstoves or cockroaches.   It can also be triggered by hormonal changes, extreme emotion, physical exertion or an upper respiratory tract illness.     It is important to identify the trigger, and then eliminate or avoid the trigger if possible.   If you have been prescribed medications to be taken on a regular basis, it is important to follow the asthma action plan and to follow guidelines to adjust medication in response to increasing symptoms of decreased peak expiratory flow rate  Treatment: I have prescribed: Albuterol (Proventil HFA; Ventolin HFA) 108 (90 Base) MCG/ACT Inhaler 2 puffs into the lungs every six hours as needed for wheezing or shortness of breath and Prednisone 40mg by mouth per day for 5 - 7 days  HOME CARE Only take medications as instructed by your medical team. Consider wearing a mask or scarf to improve breathing air temperature have been shown to decrease irritation and decrease exacerbations Get rest. Taking a steamy shower or using a humidifier may help nasal congestion sand ease sore throat pain. You can place a towel over your head and breathe in the steam from hot water coming from a faucet. Using a saline nasal spray works much the same way.  Cough drops, hare  candies and sore throat lozenges may ease your cough.  Avoid close contacts especially the very you and the elderly Cover your mouth if you cough or sneeze Always remember to wash your hands.    GET HELP RIGHT AWAY IF: You develop worsening symptoms; breathlessness at rest, drowsy, confused or agitated, unable to speak in full sentences You have coughing fits You develop a severe headache or visual changes You develop shortness of breath, difficulty breathing or start having chest pain Your symptoms persist after you have completed your treatment plan If your symptoms do not improve within 10 days  MAKE SURE YOU Understand these instructions. Will watch your condition. Will get help right away if you are not doing well or get worse.   Your e-visit answers were reviewed by a board certified advanced clinical practitioner to complete your personal care plan, Depending upon the condition, your plan could have included both over the counter or prescription medications.   Please review your pharmacy choice. Your safety is important to us. If you have drug allergies check your prescription carefully.  You can use MyChart to ask questions about today's visit, request a non-urgent  call back, or ask for a work or school excuse for 24 hours related to this e-Visit. If it has been greater than 24 hours you will need to follow up with your provider, or enter a new e-Visit to address those concerns.   You will get an e-mail in the next two days asking about your experience. I hope that your e-visit has been valuable and will speed your recovery. Thank you for using e-visits.  

## 2021-05-01 NOTE — Progress Notes (Signed)
I have spent 5 minutes in review of e-visit questionnaire, review and updating patient chart, medical decision making and response to patient.  ° °Natajah Derderian W Jais Demir, NP ° °  °

## 2021-05-04 ENCOUNTER — Encounter (HOSPITAL_BASED_OUTPATIENT_CLINIC_OR_DEPARTMENT_OTHER): Payer: Self-pay | Admitting: Obstetrics and Gynecology

## 2021-05-04 ENCOUNTER — Other Ambulatory Visit: Payer: Self-pay

## 2021-05-04 NOTE — Progress Notes (Addendum)
Spoke w/ via phone for pre-op interview---pt  Lab needs dos----   T&S    UPT      Lab results------ COVID test ----- covid test 05/07/2021 due to Asthma flare up on 05/01/2021 patient states asymptomatic no test needed Arrive at -------0530 05/11/2021 NPO after MN NO Solid Food.  Clear liquids from MN until---0430 05/11/2021 Med rec completed Medications to take morning of surgery -----albuterol inhaler and allegra Diabetic medication -----n/a Patient instructed no nail polish to be worn day of surgery Patient instructed to bring photo id and insurance card day of surgery Patient aware to have Driver (ride ) / caregiver    Husband Tanya Hayes for 24 hours after surgery  Patient Special Instructions -----bring inhaler day of surgery Pre-Op special Istructions ----- Patient verbalized understanding of instructions that were given at this phone interview. Patient denies shortness of breath, chest pain, fever, cough at this phone interview.    Message left with Dahlia Client at Dr. Mindi Slicker office notified covid test on May 07, 2021 due to Asthma flare up on 05/01/2021  Spoke dr.S Tanya Landry, about pt recent asthma flare up and on steriods 05/01/21 to 05/06/21 and going for covid test on 05/07/2021.  Pt ok for surgery on 05/11/21 at [facility] [address] per dr. Rikki Spearing.

## 2021-05-09 ENCOUNTER — Encounter (HOSPITAL_COMMUNITY): Payer: Self-pay | Admitting: Anesthesiology

## 2021-05-09 NOTE — Anesthesia Preprocedure Evaluation (Deleted)
Anesthesia Evaluation    Reviewed: Allergy & Precautions, Patient's Chart, lab work & pertinent test results  Airway        Dental   Pulmonary asthma ,           Cardiovascular Exercise Tolerance: Good negative cardio ROS       Neuro/Psych  Headaches,    GI/Hepatic negative GI ROS, Neg liver ROS,   Endo/Other    Renal/GU negative Renal ROS     Musculoskeletal   Abdominal (+) + obese (BMI 38.27),   Peds  Hematology   Anesthesia Other Findings NKDA  Reproductive/Obstetrics                            Anesthesia Physical Anesthesia Plan  ASA: 2  Anesthesia Plan: General   Post-op Pain Management: Dilaudid IV and Toradol IV (intra-op)   Induction: Intravenous  PONV Risk Score and Plan: Treatment may vary due to age or medical condition, Midazolam, Dexamethasone and Ondansetron  Airway Management Planned: Oral ETT  Additional Equipment: None  Intra-op Plan:   Post-operative Plan: Extubation in OR  Informed Consent:     Dental advisory given  Plan Discussed with: CRNA and Anesthesiologist  Anesthesia Plan Comments: (GA ETT)        Anesthesia Quick Evaluation

## 2021-05-11 ENCOUNTER — Ambulatory Visit (HOSPITAL_BASED_OUTPATIENT_CLINIC_OR_DEPARTMENT_OTHER): Admission: RE | Admit: 2021-05-11 | Payer: 59 | Source: Home / Self Care | Admitting: Obstetrics and Gynecology

## 2021-05-11 HISTORY — DX: Anxiety disorder, unspecified: F41.9

## 2021-05-11 HISTORY — DX: Unspecified osteoarthritis, unspecified site: M19.90

## 2021-05-11 SURGERY — SALPINGECTOMY, BILATERAL, LAPAROSCOPIC
Anesthesia: General | Laterality: Bilateral

## 2021-05-29 ENCOUNTER — Telehealth: Payer: 59 | Admitting: Nurse Practitioner

## 2021-05-29 DIAGNOSIS — J40 Bronchitis, not specified as acute or chronic: Secondary | ICD-10-CM

## 2021-05-29 MED ORDER — PREDNISONE 10 MG PO TABS: 20.0000 mg | ORAL_TABLET | Freq: Two times a day (BID) | ORAL | 0 refills | Status: AC

## 2021-05-29 MED ORDER — AZITHROMYCIN 250 MG PO TABS: ORAL_TABLET | ORAL | 0 refills | Status: AC

## 2021-05-29 MED ORDER — ALBUTEROL SULFATE HFA 108 (90 BASE) MCG/ACT IN AERS: 2.0000 | INHALATION_SPRAY | Freq: Four times a day (QID) | RESPIRATORY_TRACT | 0 refills | Status: AC | PRN

## 2021-05-29 NOTE — Progress Notes (Signed)
We are sorry that you are not feeling well.  Here is how we plan to help!  Based on your presentation I believe you most likely have A cough due to bacteria.  When patients have a fever and a productive cough with a change in color or increased sputum production, we are concerned about bacterial bronchitis.  If left untreated it can progress to pneumonia.  If your symptoms do not improve with your treatment plan it is important that you contact your provider.   I have prescribed Azithromyin 250 mg: two tablets now and then one tablet daily for 4 additonal days    In addition you may use A non-prescription cough medication called Mucinex DM: take 2 tablets every 12 hours.  Prednisone 10 mg daily for 6 days (see taper instructions below)  We will also send a refill on your Albuterol inhaler to assure you have enough to use during this illness.   From your responses in the eVisit questionnaire you describe inflammation in the upper respiratory tract which is causing a significant cough.  This is commonly called Bronchitis and has four common causes:   Allergies Viral Infections Acid Reflux Bacterial Infection Allergies, viruses and acid reflux are treated by controlling symptoms or eliminating the cause. An example might be a cough caused by taking certain blood pressure medications. You stop the cough by changing the medication. Another example might be a cough caused by acid reflux. Controlling the reflux helps control the cough.  USE OF BRONCHODILATOR ("RESCUE") INHALERS: There is a risk from using your bronchodilator too frequently.  The risk is that over-reliance on a medication which only relaxes the muscles surrounding the breathing tubes can reduce the effectiveness of medications prescribed to reduce swelling and congestion of the tubes themselves.  Although you feel brief relief from the bronchodilator inhaler, your asthma may actually be worsening with the tubes becoming more swollen and  filled with mucus.  This can delay other crucial treatments, such as oral steroid medications. If you need to use a bronchodilator inhaler daily, several times per day, you should discuss this with your provider.  There are probably better treatments that could be used to keep your asthma under control.     HOME CARE Only take medications as instructed by your medical team. Complete the entire course of an antibiotic. Drink plenty of fluids and get plenty of rest. Avoid close contacts especially the very young and the elderly Cover your mouth if you cough or cough into your sleeve. Always remember to wash your hands A steam or ultrasonic humidifier can help congestion.   GET HELP RIGHT AWAY IF: You develop worsening fever. You become short of breath You cough up blood. Your symptoms persist after you have completed your treatment plan MAKE SURE YOU  Understand these instructions. Will watch your condition. Will get help right away if you are not doing well or get worse.    Thank you for choosing an e-visit.  Your e-visit answers were reviewed by a board certified advanced clinical practitioner to complete your personal care plan. Depending upon the condition, your plan could have included both over the counter or prescription medications.  Please review your pharmacy choice. Make sure the pharmacy is open so you can pick up prescription now. If there is a problem, you may contact your provider through [employer] and have the prescription routed to another pharmacy.  Your safety is important to [place]. If you have drug allergies check your prescription  carefully.   For the next 24 hours you can use MyChart to ask questions about today's visit, request a non-urgent call back, or ask for a work or school excuse. You will get an email in the next two days asking about your experience. I hope that your e-visit has been valuable and will speed your recovery.   I spent approximately 7  minutes reviewing the patient's history, current symptoms and coordinating their plan of care today.    Meds ordered this encounter  Medications   predniSONE (DELTASONE) 10 MG tablet    Sig: Take 2 tablets (20 mg total) by mouth 2 (two) times daily with a meal for 5 days.    Dispense:  20 tablet    Refill:  0   azithromycin (ZITHROMAX) 250 MG tablet    Sig: Take 2 tablets on day 1, then 1 tablet daily on days 2 through 5    Dispense:  6 tablet    Refill:  0   albuterol (VENTOLIN HFA) 108 (90 Base) MCG/ACT inhaler    Sig: Inhale 2 puffs into the lungs every 6 (six) hours as needed for wheezing or shortness of breath.    Dispense:  8 g    Refill:  0

## 2021-06-09 ENCOUNTER — Telehealth: Payer: 59 | Admitting: Nurse Practitioner

## 2021-06-09 DIAGNOSIS — J4521 Mild intermittent asthma with (acute) exacerbation: Secondary | ICD-10-CM | POA: Diagnosis not present

## 2021-06-09 MED ORDER — FLUTICASONE FUROATE-VILANTEROL 100-25 MCG/ACT IN AEPB: 1.0000 | INHALATION_SPRAY | Freq: Every day | RESPIRATORY_TRACT | 11 refills | Status: DC

## 2021-06-09 MED ORDER — PREDNISONE 20 MG PO TABS: 40.0000 mg | ORAL_TABLET | Freq: Every day | ORAL | 0 refills | Status: AC

## 2021-06-09 NOTE — Progress Notes (Signed)
Virtual Visit Consent   Tanya Hayes, you are scheduled for a virtual visit with Tanya Hayes, [place], a [facility] provider, today.     Just as with appointments in the office, your consent must be obtained to participate.  Your consent will be active for this visit and any virtual visit you may have with one of our providers in the next 365 days.     If you have a MyChart account, a copy of this consent can be sent to you electronically.  All virtual visits are billed to your insurance company just like a traditional visit in the office.    As this is a virtual visit, video technology does not allow for your provider to perform a traditional examination.  This may limit your provider's ability to fully assess your condition.  If your provider identifies any concerns that need to be evaluated in person or the need to arrange testing (such as labs, EKG, etc.), we will make arrangements to do so.     Although advances in technology are sophisticated, we cannot ensure that it will always work on either your end or our end.  If the connection with a video visit is poor, the visit may have to be switched to a telephone visit.  With either a video or telephone visit, we are not always able to ensure that we have a secure connection.     I need to obtain your verbal consent now.   Are you willing to proceed with your visit today? YES   Tanya Hayes has provided verbal consent on 06/09/2021 for a virtual visit (video or telephone).   Tanya Hassell Done, FNP   Date: 06/09/2021 12:32 PM   Virtual Visit via Video Note   I, Tanya Hayes, connected with Tanya Hayes ([id number], 18-Apr-1999) on 06/09/21 at 12:30 PM EST by a video-enabled telemedicine application and verified that I am speaking with the correct person using two identifiers.  Location: Patient: Virtual Visit Location Patient: Home Provider: Virtual Visit Location Provider: Mobile   I  discussed the limitations of evaluation and management by telemedicine and the availability of in person appointments. The patient expressed understanding and agreed to proceed.    History of Present Illness: Tanya Hayes is a 36 y.o. who identifies as a female who was assigned female at birth, and is being seen today for medication refill.  HPI: Patient has been sick for about 5 weeks. She is feeling much better. She has history of asthma and she is still having trouble taking a deep breath.of wheezing. Is having  to use her albuterolevery 3-4 hours.   Review of Systems  Constitutional:  Negative for chills and fever.  HENT:  Negative for congestion.   Respiratory:  Positive for shortness of breath and wheezing. Negative for cough.   Musculoskeletal:  Negative for myalgias.  Neurological:  Negative for dizziness and headaches.   Problems:  Patient Active Problem List   Diagnosis Date Noted   Mild intermittent asthma without complication [id number]   Perennial allergic rhinitis 03/10/2021   Conjunctivitis 03/10/2021   Headache, migraine 11/14/2013   Obesity, unspecified 11/14/2013   Hypertriglyceridemia 07/15/2013   Unspecified vitamin D deficiency 07/15/2013   Unspecified asthma(493.90) 06/03/2013   PCOS (polycystic ovarian syndrome) 10/12/2011   Secondary amenorrhea 09/07/2011   Obesity 09/07/2011   Hirsutism 09/07/2011   Acanthosis nigricans 09/07/2011    Allergies: No Known Allergies Medications:  Current Outpatient Medications:  albuterol (VENTOLIN HFA) 108 (90 Base) MCG/ACT inhaler, Inhale 2 puffs into the lungs every 6 (six) hours as needed for wheezing or shortness of breath. (Patient taking differently: Inhale 2 puffs into the lungs every 6 (six) hours as needed for wheezing or shortness of breath. Once a day), Disp: 8 g, Rfl: 2   albuterol (VENTOLIN HFA) 108 (90 Base) MCG/ACT inhaler, Inhale 2 puffs into the lungs every 6 (six) hours as needed for wheezing  or shortness of breath., Disp: 8 g, Rfl: 0   cetirizine (ZYRTEC) 10 MG tablet, Take 10 mg by mouth daily. For seasonal allergies dont take in winter, Disp: , Rfl:    Etonogestrel (NEXPLANON [place]), Inject into the skin., Disp: , Rfl:    fexofenadine (ALLEGRA ALLERGY) 60 MG tablet, Take 1 tablet (60 mg total) by mouth 2 (two) times daily. (Patient taking differently: Take 60 mg by mouth daily. For her cat allergy), Disp: 30 tablet, Rfl: 0   Olopatadine HCl 0.2 % SOLN, Apply 1 drop to eye daily., Disp: 2.5 mL, Rfl: 0   Secukinumab (COSENTYX [place]), Inject into the skin. Once every 4 weeks, Disp: , Rfl:   Observations/Objective: Patient is well-developed, well-nourished in no acute distress.  Resting comfortably at home.  Head is normocephalic, atraumatic.  No labored breathing.  Speech is clear and coherent with logical content.  Patient is alert and oriented at baseline.    Assessment and Plan:  Tanya Hayes in today with chief complaint of No chief complaint on file.   1. Mild intermittent asthma with exacerbation Deep breathes Rest Force fluids Meds ordered this encounter  Medications   fluticasone furoate-vilanterol (BREO ELLIPTA) 100-25 MCG/ACT AEPB    Sig: Inhale 1 puff into the lungs daily.    Dispense:  1 each    Refill:  11    Order Specific Question:   Supervising Provider    Answer:   Sabra Heck, BRIAN [[id number]]   predniSONE (DELTASONE) 20 MG tablet    Sig: Take 2 tablets (40 mg total) by mouth daily with breakfast for 5 days. 2 po daily for 5 days    Dispense:  10 tablet    Refill:  0    Order Specific Question:   Supervising Provider    Answer:   Noemi Chapel [[id number]]       Follow Up Instructions: I discussed the assessment and treatment plan with the patient. The patient was provided an opportunity to ask questions and all were answered. The patient agreed with the plan and demonstrated an understanding of the instructions.  A copy of instructions were sent to  the patient via MyChart.  The patient was advised to call back or seek an in-person evaluation if the symptoms worsen or if the condition fails to improve as anticipated.  Time:  I spent 8 minutes with the patient via telehealth technology discussing the above problems/concerns.    Tanya Hassell Done, FNP

## 2021-06-09 NOTE — Patient Instructions (Signed)
Asthma, Adult ?Asthma is a long-term (chronic) condition in which the airways get tight and narrow. The airways are the breathing passages that lead from the nose and mouth down into the lungs. A person with asthma will have times when symptoms get worse. These are called asthma attacks. They can cause coughing, whistling sounds when you breathe (wheezing), shortness of breath, and chest pain. They can make it hard to breathe. There is no cure for asthma, but medicines and lifestyle changes can help control it. ?There are many things that can bring on an asthma attack or make asthma symptoms worse (triggers). Common triggers include: ?Mold. ?Dust. ?Cigarette smoke. ?Cockroaches. ?Things that can cause allergy symptoms (allergens). These include animal skin flakes (dander) and pollen from trees or grass. ?Things that pollute the air. These may include household cleaners, wood smoke, smog, or chemical odors. ?Cold air, weather changes, and wind. ?Crying or laughing hard. ?Stress. ?Certain medicines or drugs. ?Certain foods such as dried fruit, potato chips, and grape juice. ?Infections, such as a cold or the flu. ?Certain medical conditions or diseases. ?Exercise or tiring activities. ?Asthma may be treated with medicines and by staying away from the things that cause asthma attacks. Types of medicines may include: ?Controller medicines. These help prevent asthma symptoms. They are usually taken every day. ?Fast-acting reliever or rescue medicines. These quickly relieve asthma symptoms. They are used as needed and provide short-term relief. ?Allergy medicines if your attacks are brought on by allergens. ?Medicines to help control the body's defense (immune) system. ?Follow these instructions at home: ?Avoiding triggers in your home ?Change your heating and air conditioning filter often. ?Limit your use of fireplaces and wood stoves. ?Get rid of pests (such as roaches and mice) and their droppings. ?Throw away plants  if you see mold on them. ?Clean your floors. Dust regularly. Use cleaning products that do not smell. ?Have someone vacuum when you are not home. Use a vacuum cleaner with a HEPA filter if possible. ?Replace carpet with wood, tile, or vinyl flooring. Carpet can trap animal skin flakes and dust. ?Use allergy-proof pillows, mattress covers, and box spring covers. ?Wash bed sheets and blankets every week in hot water. Dry them in a dryer. ?Keep your bedroom free of any triggers. ?Avoid pets and keep windows closed when things that cause allergy symptoms are in the air. ?Use blankets that are made of polyester or cotton. ?Clean bathrooms and kitchens with bleach. If possible, have someone repaint the walls in these rooms with mold-resistant paint. Keep out of the rooms that are being cleaned and painted. ?Wash your hands often with soap and water. If soap and water are not available, use hand sanitizer. ?Do not allow anyone to smoke in your home. ?General instructions ?Take over-the-counter and prescription medicines only as told by your doctor. ?Talk with your doctor if you have questions about how or when to take your medicines. ?Make note if you need to use your medicines more often than usual. ?Do not use any products that contain nicotine or tobacco, such as cigarettes and e-cigarettes. If you need help quitting, ask your doctor. ?Stay away from secondhand smoke. ?Avoid doing things outdoors when allergen counts are high and when air quality is low. ?Wear a ski mask when doing outdoor activities in the winter. The mask should cover your nose and mouth. Exercise indoors on cold days if you can. ?Warm up before you exercise. Take time to cool down after exercise. ?Use a peak flow meter as   told by your doctor. A peak flow meter is a tool that measures how well the lungs are working. ?Keep track of the peak flow meter's readings. Write them down. ?Follow your asthma action plan. This is a written plan for taking care  of your asthma and treating your attacks. ?Make sure you get all the shots (vaccines) that your doctor recommends. Ask your doctor about a flu shot and a pneumonia shot. ?Keep all follow-up visits as told by your doctor. This is important. ?Contact a doctor if: ?You have wheezing, shortness of breath, or a cough even while taking medicine to prevent attacks. ?The mucus you cough up (sputum) is thicker than usual. ?The mucus you cough up changes from clear or white to yellow, green, gray, or bloody. ?You have problems from the medicine you are taking, such as: ?A rash. ?Itching. ?Swelling. ?Trouble breathing. ?You need reliever medicines more than 2-3 times a week. ?Your peak flow reading is still at 50-79% of your personal best after following the action plan for 1 hour. ?You have a fever. ?Get help right away if: ?You seem to be worse and are not responding to medicine during an asthma attack. ?You are short of breath even at rest. ?You get short of breath when doing very little activity. ?You have trouble eating, drinking, or talking. ?You have chest pain or tightness. ?You have a fast heartbeat. ?Your lips or fingernails start to turn blue. ?You are light-headed or dizzy, or you faint. ?Your peak flow is less than 50% of your personal best. ?You feel too tired to breathe normally. ?Summary ?Asthma is a long-term (chronic) condition in which the airways get tight and narrow. An asthma attack can make it hard to breathe. ?Asthma cannot be cured, but medicines and lifestyle changes can help control it. ?Make sure you understand how to avoid triggers and how and when to use your medicines. ?This information is not intended to replace advice given to you by your health care provider. Make sure you discuss any questions you have with your health care provider. ?Document Revised: 08/18/2019 Document Reviewed: 08/28/2019 ?Elsevier Patient Education ? 2022 Elsevier Inc. ? ?

## 2021-06-15 NOTE — Progress Notes (Deleted)
FOLLOW UP Date of Service/Encounter:  06/15/21   Subjective:  Tanya Hayes (DOB: [birth year]) is a 36 y.o. female who [facility] in re-evaluation of the following: perennial allergic rhinitis and intermittent asthma History obtained from: chart review and {Persons; PED relatives w/patient:19415::"patient"}.  For Review, LV was on 03/10/21  with Dr.Tameisha Covell.  WE continued her albuterol, pataday and OTC AH.  We started INCS and discussed allergy shots.  Pertinent History/Diagnostics:  - Asthma: intermittent  - pre/post spirometry (03/10/21): ratio 91% (pre), 82% FEV1 (pre), + 12% FEV1 (post) - Allergic Rhinitis:   - SPT environmental panel (03/10/21): DM; intradermal + cat  Today presents for follow-up.     Allergies as of 06/16/2021   No Known Allergies      Medication List        Accurate as of June 15, 2021 12:26 PM. If you have any questions, ask your nurse or doctor.          albuterol 108 (90 Base) MCG/ACT inhaler Commonly known as: VENTOLIN HFA Inhale 2 puffs into the lungs every 6 (six) hours as needed for wheezing or shortness of breath. What changed: additional instructions   albuterol 108 (90 Base) MCG/ACT inhaler Commonly known as: VENTOLIN HFA Inhale 2 puffs into the lungs every 6 (six) hours as needed for wheezing or shortness of breath. What changed: Another medication with the same name was changed. Make sure you understand how and when to take each.   cetirizine 10 MG tablet Commonly known as: ZYRTEC Take 10 mg by mouth daily. For seasonal allergies dont take in winter   COSENTYX [place] Inject into the skin. Once every 4 weeks   fexofenadine 60 MG tablet Commonly known as: Allegra Allergy Take 1 tablet (60 mg total) by mouth 2 (two) times daily. What changed:  when to take this additional instructions   fluticasone furoate-vilanterol 100-25 MCG/ACT Aepb Commonly known as: BREO  ELLIPTA Inhale 1 puff into the lungs daily.   NEXPLANON [place] Inject into the skin.   Olopatadine HCl 0.2 % Soln Apply 1 drop to eye daily.       Past Medical History:  Diagnosis Date   Anxiety    Arthritis    Asthma    Obesity    PCOS (polycystic ovarian syndrome)    Psoriasis    Past Surgical History:  Procedure Laterality Date   BREAST REDUCTION SURGERY     CLOSED REDUCTION FINGER WITH PERCUTANEOUS PINNING Right 10/26/2017   Procedure: PINNING PIP JOINT;  Surgeon: Cindee Salt, MD;  Location: [facility];  Service: Orthopedics;  Laterality: Right;   REPAIR EXTENSOR TENDON Right 10/26/2017   Procedure: EXTENSOR RETINACULUM GRAFT;  Surgeon: Cindee Salt, MD;  Location: [facility];  Service: Orthopedics;  Laterality: Right;   TRIGGER FINGER RELEASE Right 10/26/2017   Procedure: RECONSTRUCTION A-2 PULLEY RIGHT RING FINGER;  Surgeon: Cindee Salt, MD;  Location: [facility];  Service: Orthopedics;  Laterality: Right;   Otherwise, there have been no changes to her past medical history, surgical history, family history, or social history.  ROS: All others negative except as noted per HPI.   Objective:  There were no vitals taken for this visit. There is no height or weight on file to calculate BMI. Physical Exam: General Appearance:  Alert, cooperative, no distress, appears stated age  Head:  Normocephalic, without obvious abnormality, atraumatic  Eyes:  Conjunctiva clear, EOM's intact  Nose: Nares normal, {Blank multiple:19196:a:"***","hypertrophic turbinates","normal mucosa","no visible anterior polyps","septum midline"}  Throat: Lips, tongue normal; teeth and gums normal, {Blank multiple:19196:a:"***","normal posterior oropharynx","tonsils 2+","tonsils 3+","no tonsillar exudate","+ cobblestoning"}  Neck: Supple, symmetrical  Lungs:   {Blank multiple:19196:a:"***","clear to auscultation bilaterally","end-expiratory wheezing","wheezing  throughout"}, Respirations unlabored, {Blank multiple:19196:a:"***","no coughing","intermittent dry coughing"}  Heart:  {Blank multiple:19196:a:"***","regular rate and rhythm","no murmur"}, Appears well perfused  Extremities: No edema  Skin: Skin color, texture, turgor normal, no rashes or lesions on visualized portions of skin  Neurologic: No gross deficits   Reviewed: ***  Spirometry:  Tracings reviewed. Her effort: {Blank single:19197::"Good reproducible efforts.","It was hard to get consistent efforts and there is a question as to whether this reflects a maximal maneuver.","Poor effort, data can not be interpreted.","Variable effort-results affected.","decent for first attempt at spirometry."} FVC: ***L FEV1: ***L, ***% predicted FEV1/FVC ratio: ***% Interpretation: {Blank single:19197::"Spirometry consistent with mild obstructive disease","Spirometry consistent with moderate obstructive disease","Spirometry consistent with severe obstructive disease","Spirometry consistent with possible restrictive disease","Spirometry consistent with mixed obstructive and restrictive disease","Spirometry uninterpretable due to technique","Spirometry consistent with normal pattern","No overt abnormalities noted given today's efforts"}.  Please see scanned spirometry results for details.  Skin Testing: {Blank single:19197::"Select foods","Environmental allergy panel","Environmental allergy panel and select foods","Food allergy panel","None","Deferred due to recent antihistamines use"}. Positive test to: ***. Negative test to: ***.  Results discussed with patient/family.   {Blank single:19197::"Allergy testing results were read and interpreted by myself, documented by clinical staff."," "}  Assessment/Plan  There are no Patient Instructions on file for this visit.  Tonny Bollman, MD  [facility]

## 2021-06-16 ENCOUNTER — Ambulatory Visit: Payer: 59 | Admitting: Internal Medicine

## 2021-06-16 DIAGNOSIS — J309 Allergic rhinitis, unspecified: Secondary | ICD-10-CM

## 2022-01-02 ENCOUNTER — Other Ambulatory Visit: Payer: Self-pay | Admitting: Nurse Practitioner

## 2022-01-02 DIAGNOSIS — J40 Bronchitis, not specified as acute or chronic: Secondary | ICD-10-CM

## 2022-08-13 ENCOUNTER — Other Ambulatory Visit: Payer: Self-pay | Admitting: Nurse Practitioner

## 2022-08-22 ENCOUNTER — Telehealth: Payer: 59 | Admitting: Nurse Practitioner

## 2022-08-22 DIAGNOSIS — J4520 Mild intermittent asthma, uncomplicated: Secondary | ICD-10-CM | POA: Diagnosis not present

## 2022-08-22 DIAGNOSIS — J069 Acute upper respiratory infection, unspecified: Secondary | ICD-10-CM | POA: Diagnosis not present

## 2022-08-22 MED ORDER — ALBUTEROL SULFATE HFA 108 (90 BASE) MCG/ACT IN AERS: 2.0000 | INHALATION_SPRAY | Freq: Four times a day (QID) | RESPIRATORY_TRACT | 0 refills | Status: AC | PRN

## 2022-08-22 MED ORDER — OSELTAMIVIR PHOSPHATE 75 MG PO CAPS: 75.0000 mg | ORAL_CAPSULE | Freq: Two times a day (BID) | ORAL | 0 refills | Status: AC

## 2022-08-22 NOTE — Progress Notes (Signed)
E visit for Flu like symptoms   We are sorry that you are not feeling well.  Here is how we plan to help! Based on what you have shared with me it looks like you may have a respiratory virus that may be influenza.  Influenza or "the flu" is   an infection caused by a respiratory virus. The flu virus is highly contagious and persons who did not receive their yearly flu vaccination may "catch" the flu from close contact.  We have anti-viral medications to treat the viruses that cause this infection. They are not a "cure" and only shorten the course of the infection. These prescriptions are most effective when they are given within the first 2 days of "flu" symptoms. Antiviral medication are indicated if you have a high risk of complications from the flu. You should  also consider an antiviral medication if you are in close contact with someone who is at risk. These medications can help patients avoid complications from the flu  but have side effects that you should know. Possible side effects from Tamiflu or oseltamivir include nausea, vomiting, diarrhea, dizziness, headaches, eye redness, sleep problems or other respiratory symptoms. You should not take Tamiflu if you have an allergy to oseltamivir or any to the ingredients in Tamiflu.  Based upon your symptoms and potential risk factors I have prescribed Oseltamivir (Tamiflu).  It has been sent to your designated pharmacy.  You will take one 75 mg capsule orally twice a day for the next 5 days.  We will also refill your Albuterol inhaler so you can use that as needed while sick   ANYONE WHO HAS FLU SYMPTOMS SHOULD: Stay home. The flu is highly contagious and going out or to work exposes others! Be sure to drink plenty of fluids. Water is fine as well as fruit juices, sodas and electrolyte beverages. You may want to stay away from caffeine or alcohol. If you are nauseated, try taking small sips of liquids. How do you know if you are getting enough  fluid? Your urine should be a pale yellow or almost colorless. Get rest. Taking a steamy shower or using a humidifier may help nasal congestion and ease sore throat pain. Using a saline nasal spray works much the same way. Cough drops, hard candies and sore throat lozenges may ease your cough. Line up a caregiver. Have someone check on you regularly.   GET HELP RIGHT AWAY IF: You cannot keep down liquids or your medications. You become short of breath Your fell like you are going to pass out or loose consciousness. Your symptoms persist after you have completed your treatment plan MAKE SURE YOU  Understand these instructions. Will watch your condition. Will get help right away if you are not doing well or get worse.  Your e-visit answers were reviewed by a board certified advanced clinical practitioner to complete your personal care plan.  Depending on the condition, your plan could have included both over the counter or prescription medications.  If there is a problem please reply  once you have received a response from your provider.  Your safety is important to [place].  If you have drug allergies check your prescription carefully.    You can use MyChart to ask questions about today's visit, request a non-urgent call back, or ask for a work or school excuse for 24 hours related to this e-Visit. If it has been greater than 24 hours you will need to follow up with your provider,  or enter a new e-Visit to address those concerns.  You will get an e-mail in the next two days asking about your experience.  I hope that your e-visit has been valuable and will speed your recovery. Thank you for using e-visits.   Meds ordered this encounter  Medications   oseltamivir (TAMIFLU) 75 MG capsule    Sig: Take 1 capsule (75 mg total) by mouth 2 (two) times daily for 5 days.    Dispense:  10 capsule    Refill:  0   albuterol (VENTOLIN HFA) 108 (90 Base) MCG/ACT inhaler    Sig: Inhale 2 puffs into the  lungs every 6 (six) hours as needed for wheezing or shortness of breath.    Dispense:  8 g    Refill:  0    I spent approximately 5 minutes reviewing the patient's history, current symptoms and coordinating their care today.

## 2022-09-10 ENCOUNTER — Telehealth: Payer: 59 | Admitting: Nurse Practitioner

## 2022-09-10 DIAGNOSIS — R21 Rash and other nonspecific skin eruption: Secondary | ICD-10-CM

## 2022-09-10 NOTE — Progress Notes (Signed)
Based on what you shared with me it looks like you have vaginal/perineal/anal irritation,that should be evaluated in a face to face office visit. Need to be seen for proper diagnosis and treatment of rash .  NOTE: There will be NO CHARGE for this eVisit   If you are having a true medical emergency please call 911.      For an urgent face to face visit, [facility] has six urgent care centers for your convenience:     [facility] Center at [facility] Directions [phone] [address] [place], [place] [address]    [facility] Health Urgent Care Center [facility]) Get Driving Directions [phone] [address] [place], [place] [address]  [facility] Center [facility]) Get Driving Directions [phone] [address] [place],  [place]  [address]  [facility] Health Urgent Care at [facility] Get Driving Directions [phone] [address] [place], [place] [address]   [facility] Health Urgent Care at [facility] Get Driving Directions  [phone] [address] [place], [place] [address]   [facility] Health Urgent Care at [facility] Directions [phone] [address], [address] [place], [place] [address]  Your MyChart E-visit questionnaire answers were reviewed by a board certified advanced clinical practitioner to complete your personal care plan based on your specific symptoms.  Thank you for using e-Visits.

## 2022-09-13 ENCOUNTER — Telehealth: Payer: 59 | Admitting: Physician Assistant

## 2022-09-13 DIAGNOSIS — H1033 Unspecified acute conjunctivitis, bilateral: Secondary | ICD-10-CM

## 2022-09-13 MED ORDER — POLYMYXIN B-TRIMETHOPRIM 10000-0.1 UNIT/ML-% OP SOLN: OPHTHALMIC | 0 refills | Status: DC

## 2022-09-13 NOTE — Progress Notes (Unsigned)
E-Visit for Tanya Hayes   We are sorry that you are not feeling well.  Here is how we plan to help!  Based on what you have shared with me it looks like you have conjunctivitis.  Conjunctivitis is a common inflammatory or infectious condition of the eye that is often referred to as "pink eye".  In most cases it is contagious (viral or bacterial). However, not all conjunctivitis requires antibiotics (ex. Allergic).  We have made appropriate suggestions for you based upon your presentation.  I have prescribed Polytrim Ophthalmic drops 1-2 drops 4 times a day times 5 days. Please continue your regular allergy medications, adding on an OTC Olopatadine drop  Pink eye can be highly contagious.  It is typically spread through direct contact with secretions, or contaminated objects or surfaces that one may have touched.  Strict handwashing is suggested with soap and water is urged.  If not available, use alcohol based had sanitizer.  Avoid unnecessary touching of the eye.  If you wear contact lenses, you will need to refrain from wearing them until you see no white discharge from the eye for at least 24 hours after being on medication.  You should see symptom improvement in 1-2 days after starting the medication regimen.  Call us if symptoms are not improved in 1-2 days.  Home Care: Wash your hands often! Do not wear your contacts until you complete your treatment plan. Avoid sharing towels, bed linen, personal items with a person who has pink eye. See attention for anyone in your home with similar symptoms.  Get Help Right Away If: Your symptoms do not improve. You develop blurred or loss of vision. Your symptoms worsen (increased discharge, pain or redness)   Thank you for choosing an e-visit.  Your e-visit answers were reviewed by a board certified advanced clinical practitioner to complete your personal care plan. Depending upon the condition, your plan could have included both over the counter or  prescription medications.  Please review your pharmacy choice. Make sure the pharmacy is open so you can pick up prescription now. If there is a problem, you may contact your provider through [employer] and have the prescription routed to another pharmacy.  Your safety is important to [place]. If you have drug allergies check your prescription carefully.   For the next 24 hours you can use MyChart to ask questions about today's visit, request a non-urgent call back, or ask for a work or school excuse. You will get an email in the next two days asking about your experience. I hope that your e-visit has been valuable and will speed your recovery.

## 2022-09-13 NOTE — Progress Notes (Signed)
I have spent 5 minutes in review of e-visit questionnaire, review and updating patient chart, medical decision making and response to patient.   Marlyn Tondreau Cody Jovanka Westgate, PA-C    

## 2022-10-21 ENCOUNTER — Telehealth: Payer: 59 | Admitting: Physician Assistant

## 2022-10-21 DIAGNOSIS — J4521 Mild intermittent asthma with (acute) exacerbation: Secondary | ICD-10-CM

## 2022-10-22 MED ORDER — FLUTICASONE FUROATE-VILANTEROL 100-25 MCG/ACT IN AEPB: 1.0000 | INHALATION_SPRAY | Freq: Every day | RESPIRATORY_TRACT | 0 refills | Status: AC

## 2022-10-22 NOTE — Progress Notes (Signed)
E-Visit for Asthma  Based on what you have shared with me, it looks like you may have a flare up of your asthma.  Asthma is a chronic (ongoing) lung disease which results in airway obstruction, inflammation and hyper-responsiveness.   Asthma symptoms vary from person to person, with common symptoms including nighttime awakening and decreased ability to participate in normal activities as a result of shortness of breath. It is often triggered by changes in weather, changes in the season, changes in air temperature, or inside (home, school, daycare or work) allergens such as animal dander, mold, mildew, woodstoves or cockroaches.   It can also be triggered by hormonal changes, extreme emotion, physical exertion or an upper respiratory tract illness.     It is important to identify the trigger, and then eliminate or avoid the trigger if possible.   If you have been prescribed medications to be taken on a regular basis, it is important to follow the asthma action plan and to follow guidelines to adjust medication in response to increasing symptoms of decreased peak expiratory flow rate  Treatment: I have prescribed: Breo Ellipta inhaler. If no improvement please follow up for in person evaluation.   HOME CARE Only take medications as instructed by your medical team. Consider wearing a mask or scarf to improve breathing air temperature have been shown to decrease irritation and decrease exacerbations Get rest. Taking a steamy shower or using a humidifier may help nasal congestion sand ease sore throat pain. You can place a towel over your head and breathe in the steam from hot water coming from a faucet. Using a saline nasal spray works much the same way.  Cough drops, hare candies and sore throat lozenges may ease your cough.  Avoid close contacts especially the very you and the  elderly Cover your mouth if you cough or sneeze Always remember to wash your hands.    GET HELP RIGHT AWAY IF: You develop worsening symptoms; breathlessness at rest, drowsy, confused or agitated, unable to speak in full sentences You have coughing fits You develop a severe headache or visual changes You develop shortness of breath, difficulty breathing or start having chest pain Your symptoms persist after you have completed your treatment plan If your symptoms do not improve within 10 days  MAKE SURE YOU Understand these instructions. Will watch your condition. Will get help right away if you are not doing well or get worse.   Your e-visit answers were reviewed by a board certified advanced clinical practitioner to complete your personal care plan, Depending upon the condition, your plan could have included both over the counter or prescription medications.   Please review your pharmacy choice. Your safety is important to [place]. If you have drug allergies check your prescription carefully.  You can use MyChart to ask questions about today's visit, request a non-urgent  call back, or ask for a work or school excuse for 24 hours related to this e-Visit. If it has been greater than 24 hours you will need to follow up with your provider, or enter a new e-Visit to address those concerns.   You will get an e-mail in the next two days asking about your experience. I hope that your e-visit has been valuable and will speed your recovery. Thank you for using e-visits.   I have spent 5 minutes in review of e-visit questionnaire, review and updating patient chart, medical decision making and response to patient.   Tylene Fantasia Ward, PA-C

## 2023-02-13 ENCOUNTER — Other Ambulatory Visit (HOSPITAL_COMMUNITY): Payer: Self-pay

## 2023-02-13 MED ORDER — AMPHETAMINE-DEXTROAMPHETAMINE 20 MG PO TABS
ORAL_TABLET | ORAL | 0 refills | Status: DC
  Filled 2023-02-13: qty 30, 30d supply, fill #0

## 2023-02-13 MED ORDER — AMPHETAMINE-DEXTROAMPHET ER 20 MG PO CP24
ORAL_CAPSULE | ORAL | 0 refills | Status: DC
  Filled 2023-02-13: qty 30, 30d supply, fill #0

## 2023-02-15 ENCOUNTER — Other Ambulatory Visit (HOSPITAL_COMMUNITY): Payer: Self-pay

## 2023-03-27 ENCOUNTER — Other Ambulatory Visit (HOSPITAL_COMMUNITY): Payer: Self-pay

## 2023-03-27 MED ORDER — AMPHETAMINE-DEXTROAMPHET ER 20 MG PO CP24
20.0000 mg | ORAL_CAPSULE | Freq: Every morning | ORAL | 0 refills | Status: DC
  Filled 2023-03-27: qty 30, 30d supply, fill #0

## 2023-03-27 MED ORDER — AMPHETAMINE-DEXTROAMPHETAMINE 20 MG PO TABS
20.0000 mg | ORAL_TABLET | Freq: Every day | ORAL | 0 refills | Status: DC
  Filled 2023-03-27: qty 30, 30d supply, fill #0

## 2023-03-28 ENCOUNTER — Other Ambulatory Visit (HOSPITAL_COMMUNITY): Payer: Self-pay

## 2023-05-09 ENCOUNTER — Other Ambulatory Visit (HOSPITAL_COMMUNITY): Payer: Self-pay

## 2023-05-09 MED ORDER — AMPHETAMINE-DEXTROAMPHETAMINE 20 MG PO TABS
20.0000 mg | ORAL_TABLET | Freq: Every day | ORAL | 0 refills | Status: DC
  Filled 2023-05-09: qty 30, 30d supply, fill #0

## 2023-05-09 MED ORDER — AMPHETAMINE-DEXTROAMPHET ER 20 MG PO CP24
20.0000 mg | ORAL_CAPSULE | Freq: Every morning | ORAL | 0 refills | Status: DC
  Filled 2023-05-09: qty 30, 30d supply, fill #0

## 2023-05-12 ENCOUNTER — Other Ambulatory Visit (HOSPITAL_COMMUNITY): Payer: Self-pay

## 2023-05-12 ENCOUNTER — Encounter (HOSPITAL_COMMUNITY): Payer: Self-pay

## 2023-06-16 ENCOUNTER — Other Ambulatory Visit (HOSPITAL_COMMUNITY): Payer: Self-pay

## 2023-06-16 MED ORDER — AMPHETAMINE-DEXTROAMPHET ER 20 MG PO CP24
20.0000 mg | ORAL_CAPSULE | Freq: Every morning | ORAL | 0 refills | Status: DC
  Filled 2023-06-16: qty 30, 30d supply, fill #0

## 2023-06-16 MED ORDER — AMPHETAMINE-DEXTROAMPHETAMINE 20 MG PO TABS: 20.0000 mg | ORAL_TABLET | Freq: Every day | ORAL | 0 refills | Status: DC

## 2023-07-17 ENCOUNTER — Other Ambulatory Visit (HOSPITAL_COMMUNITY): Payer: Self-pay

## 2023-07-17 MED ORDER — AMPHETAMINE-DEXTROAMPHET ER 20 MG PO CP24
20.0000 mg | ORAL_CAPSULE | Freq: Every morning | ORAL | 0 refills | Status: DC
  Filled 2023-07-17: qty 30, 30d supply, fill #0

## 2023-07-17 MED ORDER — AMPHETAMINE-DEXTROAMPHETAMINE 20 MG PO TABS
ORAL_TABLET | ORAL | 0 refills | Status: DC
  Filled 2023-07-17: qty 30, 30d supply, fill #0

## 2023-08-17 ENCOUNTER — Other Ambulatory Visit (HOSPITAL_COMMUNITY): Payer: Self-pay

## 2023-08-17 MED ORDER — AMPHETAMINE-DEXTROAMPHETAMINE 20 MG PO TABS
20.0000 mg | ORAL_TABLET | Freq: Every day | ORAL | 0 refills | Status: DC
  Filled 2023-08-17: qty 30, 30d supply, fill #0

## 2023-08-17 MED ORDER — AMPHETAMINE-DEXTROAMPHET ER 20 MG PO CP24
20.0000 mg | ORAL_CAPSULE | Freq: Every morning | ORAL | 0 refills | Status: DC
Start: 2023-08-17 — End: 2023-10-18
  Filled 2023-08-17: qty 30, 30d supply, fill #0

## 2023-08-18 ENCOUNTER — Other Ambulatory Visit (HOSPITAL_COMMUNITY): Payer: Self-pay

## 2023-10-18 ENCOUNTER — Other Ambulatory Visit (HOSPITAL_COMMUNITY): Payer: Self-pay

## 2023-10-18 MED ORDER — AMPHETAMINE-DEXTROAMPHETAMINE 20 MG PO TABS
20.0000 mg | ORAL_TABLET | Freq: Every day | ORAL | 0 refills | Status: AC
  Filled 2023-10-18 – 2023-10-28 (×2): qty 30, 30d supply, fill #0

## 2023-10-18 MED ORDER — AMPHETAMINE-DEXTROAMPHET ER 20 MG PO CP24
20.0000 mg | ORAL_CAPSULE | Freq: Every morning | ORAL | 0 refills | Status: AC
  Filled 2023-10-18 – 2023-10-28 (×2): qty 30, 30d supply, fill #0

## 2023-10-28 ENCOUNTER — Other Ambulatory Visit (HOSPITAL_COMMUNITY): Payer: Self-pay

## 2023-11-02 ENCOUNTER — Other Ambulatory Visit (HOSPITAL_COMMUNITY): Payer: Self-pay

## 2023-12-08 ENCOUNTER — Other Ambulatory Visit: Payer: Self-pay

## 2023-12-08 ENCOUNTER — Encounter (HOSPITAL_COMMUNITY): Payer: Self-pay | Admitting: *Deleted

## 2023-12-08 ENCOUNTER — Ambulatory Visit (INDEPENDENT_AMBULATORY_CARE_PROVIDER_SITE_OTHER)

## 2023-12-08 ENCOUNTER — Ambulatory Visit (HOSPITAL_COMMUNITY)
Admission: EM | Admit: 2023-12-08 | Discharge: 2023-12-08 | Disposition: A | Discharge status: Home / Self Care | Attending: Family Medicine | Admitting: Family Medicine

## 2023-12-08 DIAGNOSIS — G5701 Lesion of sciatic nerve, right lower limb: Secondary | ICD-10-CM

## 2023-12-08 DIAGNOSIS — M21371 Foot drop, right foot: Secondary | ICD-10-CM | POA: Diagnosis not present

## 2023-12-08 NOTE — ED Triage Notes (Signed)
 PT reports for a week she has noticed a change in her RT foot. She has had numbness and she can hardly feel when she touches the top of her foot. Pt also has noticed she has a hard time bringing her toes up and has foot drop.

## 2023-12-08 NOTE — ED Provider Notes (Signed)
 [facility]    CSN: [id number] Arrival date & time: 12/08/23  1901      History   Chief Complaint Chief Complaint  Patient presents with   Foot Problem    HPI Tanya Hayes is a 38 y.o. female.   HPI Here for numbness of the top of her right foot and difficulty dorsiflexing her foot. About 1 week ago she started noticing some difficulty when she was walking that her right foot would kind of slap part into the ground.  Then she started noticing that it was hard for her to raise her right toes and it has been getting more more difficult to move her right foot from the gas to the brake.  She also has been noticing tingling and numbness of the dorsum of the right foot.  No pain in her knee or in her back.  No fever and no trauma.  She maybe has a little soreness on the right lateral lower leg just proximal to the ankle.  No rash.  Past medical history is significant for psoriatic arthritis for which she uses Skyrizi.  NKDA  Last menstrual cycle was July 28  She did start a new job working from home about 2 weeks ago and she has been crossing her legs a lot.    Past Medical History:  Diagnosis Date   Anxiety    Arthritis    Asthma    Obesity    PCOS (polycystic ovarian syndrome)    Psoriasis     Patient Active Problem List   Diagnosis Date Noted   Mild intermittent asthma without complication 03/10/2021   Perennial allergic rhinitis 03/10/2021   Conjunctivitis 03/10/2021   Pain of cervical spine 07/19/2017   Pain in finger of right hand 06/28/2017   Rupture of flexor sheath pulley of finger 06/28/2017   Swelling of right ring finger 06/16/2017   Headache, migraine 11/14/2013   Obesity, unspecified 11/14/2013   Hypertriglyceridemia 07/15/2013   Vitamin D  deficiency 07/15/2013   Asthma 06/03/2013   Polycystic ovaries 10/12/2011   Secondary amenorrhea 09/07/2011   Obesity 09/07/2011   Hirsutism 09/07/2011   Acanthosis nigricans  09/07/2011    Past Surgical History:  Procedure Laterality Date   BREAST REDUCTION SURGERY     CLOSED REDUCTION FINGER WITH PERCUTANEOUS PINNING Right 10/26/2017   Procedure: PINNING PIP JOINT;  Surgeon: Murrell Kuba, MD;  Location: [facility];  Service: Orthopedics;  Laterality: Right;   REPAIR EXTENSOR TENDON Right 10/26/2017   Procedure: EXTENSOR RETINACULUM GRAFT;  Surgeon: Murrell Kuba, MD;  Location: [facility];  Service: Orthopedics;  Laterality: Right;   TRIGGER FINGER RELEASE Right 10/26/2017   Procedure: RECONSTRUCTION A-2 PULLEY RIGHT RING FINGER;  Surgeon: Murrell Kuba, MD;  Location: [facility];  Service: Orthopedics;  Laterality: Right;    OB History     Gravida  2   Para  2   Term  2   Preterm      AB      Living  2      SAB      IAB      Ectopic      Multiple      Live Births               Home Medications    Prior to Admission medications   Medication Sig Start Date End Date Taking? Authorizing Provider  albuterol  (VENTOLIN  HFA) 108 (90 Base) MCG/ACT inhaler Inhale  2 puffs into the lungs every 6 (six) hours as needed for wheezing or shortness of breath. Patient taking differently: Inhale 2 puffs into the lungs every 6 (six) hours as needed for wheezing or shortness of breath. Once a day 05/01/21  Yes Fleming, Zelda W, NP  albuterol  (VENTOLIN  HFA) 108 (90 Base) MCG/ACT inhaler Inhale 2 puffs into the lungs every 6 (six) hours as needed for wheezing or shortness of breath. 05/29/21  Yes Kennyth Domino, FNP  albuterol  (VENTOLIN  HFA) 108 (90 Base) MCG/ACT inhaler Inhale 2 puffs into the lungs every 6 (six) hours as needed for wheezing or shortness of breath. 08/22/22  Yes Kennyth Domino, FNP  amphetamine -dextroamphetamine  (ADDERALL XR) 20 MG 24 hr capsule Take 1 capsule (20 mg total) by mouth every morning. 10/18/23  Yes   amphetamine -dextroamphetamine  (ADDERALL) 20 MG tablet Take 1 tablet (20 mg total) by mouth  daily in the afternoon. 10/18/23  Yes   cetirizine (ZYRTEC) 10 MG tablet Take 10 mg by mouth daily. For seasonal allergies dont take in winter   Yes [provider]  fluticasone  furoate-vilanterol (BREO ELLIPTA ) 100-25 MCG/ACT AEPB Inhale 1 puff into the lungs daily. 10/22/22  Yes Ward, Harlene PEDLAR, PA-C  Etonogestrel  (NEXPLANON  [place]) Inject into the skin.    [provider]  fexofenadine  (ALLEGRA  ALLERGY) 60 MG tablet Take 1 tablet (60 mg total) by mouth 2 (two) times daily. Patient taking differently: Take 60 mg by mouth daily. For her cat allergy 01/25/21   Vivienne Delon HERO, PA-C    Family History Family History  Problem Relation Age of Onset   Hypertension Maternal Grandmother    Thyroid disease Maternal Grandmother    Stroke Maternal Grandmother    Diabetes Father    Stroke Maternal Grandfather     Social History Social History   Tobacco Use   Smoking status: Never   Smokeless tobacco: Never  Vaping Use   Vaping status: Never Used  Substance Use Topics   Alcohol use: Not Currently   Drug use: No     Allergies   Patient has no known allergies.   Review of Systems Review of Systems   Physical Exam Triage Vital Signs ED Triage Vitals  Encounter Vitals Group     BP 12/08/23 1933 127/85     Girls Systolic BP Percentile --      Girls Diastolic BP Percentile --      Boys Systolic BP Percentile --      Boys Diastolic BP Percentile --      Pulse Rate 12/08/23 1933 74     Resp 12/08/23 1933 20     Temp 12/08/23 1933 99.4 F (37.4 C)     Temp src --      SpO2 12/08/23 1933 94 %     Weight --      Height --      Head Circumference --      Peak Flow --      Pain Score 12/08/23 1929 0     Pain Loc --      Pain Education --      Exclude from Growth Chart --    No data found.  Updated Vital Signs BP 127/85   Pulse 74   Temp 99.4 F (37.4 C)   Resp 20   LMP 12/04/2023   SpO2 94%   Visual Acuity Right Eye Distance:   Left Eye Distance:    Bilateral Distance:    Right Eye Near:   Left  Eye Near:    Bilateral Near:     Physical Exam Vitals reviewed.  Constitutional:      General: She is not in acute distress.    Appearance: She is not ill-appearing, toxic-appearing or diaphoretic.  HENT:     Nose: Nose normal.     Mouth/Throat:     Mouth: Mucous membranes are moist.  Eyes:     Extraocular Movements: Extraocular movements intact.     Conjunctiva/sclera: Conjunctivae normal.     Pupils: Pupils are equal, round, and reactive to light.  Cardiovascular:     Rate and Rhythm: Normal rate and regular rhythm.     Heart sounds: No murmur heard. Pulmonary:     Effort: Pulmonary effort is normal.     Breath sounds: Normal breath sounds.  Musculoskeletal:     Cervical back: Neck supple.  Lymphadenopathy:     Cervical: No cervical adenopathy.  Skin:    Coloration: Skin is not jaundiced or pale.  Neurological:     Mental Status: She is alert and oriented to person, place, and time.     Comments: There is decreased sensation and dysesthesia when the dorsum of the right foot is palpated.  Pulses are normal there.  There is no swelling or edema.  There is weakness of dorsiflexion on the right compared to the left foot.  There is no deformity or swelling around the right knee.  Straight leg raise is negative.   Psychiatric:        Behavior: Behavior normal.      UC Treatments / Results  Labs (all labs ordered are listed, but only abnormal results are displayed) Labs Reviewed - No data to display  EKG   Radiology DG Knee Complete 4 Views Right Result Date: 12/08/2023 EXAM: 4 OR MORE VIEW(S) XRAY OF THE RIGHT KNEE 12/08/2023 08:01:33 PM COMPARISON: None available. CLINICAL HISTORY: Peroneal nerve compression/foot drop. PT reports for a week she has noticed a change in her right foot. She has had numbness and she can hardly feel when she touches the top of her foot. Pt also has noticed she has a hard time bringing her  toes up and has foot drop. FINDINGS: BONES AND JOINTS: No acute fracture. No focal osseous lesion. No joint dislocation. No significant joint effusion. No significant degenerative changes. SOFT TISSUES: The soft tissues are unremarkable. IMPRESSION: 1. No significant abnormality. Electronically signed by: Franky Stanford MD 12/08/2023 08:05 PM EDT RP Workstation: [id number]    Procedures Procedures (including critical care time)  Medications Ordered in UC Medications - No data to display  Initial Impression / Assessment and Plan / UC Course  I have reviewed the triage vital signs and the nursing notes.  Pertinent labs & imaging results that were available during my care of the patient were reviewed by me and considered in my medical decision making (see chart for details).     This patient has foot drop, most likely due to common peroneal compression.  X-rays done to look at the knee and it is normal.  I discussed with her potential causes.  She is going to stop crossing her legs much at all.  I have also asked her to walk around frequently while she is working. She has a primary care and she will call them for an appointment when they are open on August 4.  I have given her contact information for neurology also.  I have also asked her to call her rheumatologist about this issue.  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  Please follow-up with your primary care  Please call the neurology office also.     ED Prescriptions   None    PDMP not reviewed this encounter.   Vonna Sharlet POUR, MD 12/08/23 2010

## 2023-12-08 NOTE — Discharge Instructions (Addendum)
 X-rays of your knee were normal.  Please follow-up with your primary care  Please call the neurology office also.

## 2023-12-11 ENCOUNTER — Encounter: Payer: Self-pay | Admitting: Neurology

## 2023-12-21 ENCOUNTER — Other Ambulatory Visit: Payer: Self-pay

## 2023-12-21 DIAGNOSIS — R202 Paresthesia of skin: Secondary | ICD-10-CM

## 2023-12-25 ENCOUNTER — Encounter: Payer: Self-pay | Admitting: Physical Medicine & Rehabilitation

## 2023-12-28 ENCOUNTER — Other Ambulatory Visit: Payer: Self-pay

## 2023-12-28 ENCOUNTER — Ambulatory Visit: Attending: Internal Medicine

## 2023-12-28 DIAGNOSIS — M6281 Muscle weakness (generalized): Secondary | ICD-10-CM | POA: Diagnosis not present

## 2023-12-28 DIAGNOSIS — R2689 Other abnormalities of gait and mobility: Secondary | ICD-10-CM | POA: Insufficient documentation

## 2023-12-28 NOTE — Therapy (Signed)
 OUTPATIENT PHYSICAL THERAPY LOWER EXTREMITY EVALUATION   Patient Name: Tanya Hayes MRN: [id number] DOB:1986-01-08, 38 y.o., female Today's Date: 12/28/2023  END OF SESSION:  PT End of Session - 12/28/23 0758     Visit Number 1    Number of Visits 9    Date for PT Re-Evaluation 02/22/24    Authorization Type [place] MEDICAID [employer]    PT Start Time 0800    PT Stop Time 0840    PT Time Calculation (min) 40 min    Activity Tolerance Patient tolerated treatment well    Behavior During Therapy WFL for tasks assessed/performed          Past Medical History:  Diagnosis Date   Anxiety    Arthritis    Asthma    Obesity    PCOS (polycystic ovarian syndrome)    Psoriasis    Past Surgical History:  Procedure Laterality Date   BREAST REDUCTION SURGERY     CLOSED REDUCTION FINGER WITH PERCUTANEOUS PINNING Right 10/26/2017   Procedure: PINNING PIP JOINT;  Surgeon: Murrell Kuba, MD;  Location: [facility];  Service: Orthopedics;  Laterality: Right;   REPAIR EXTENSOR TENDON Right 10/26/2017   Procedure: EXTENSOR RETINACULUM GRAFT;  Surgeon: Murrell Kuba, MD;  Location: [facility];  Service: Orthopedics;  Laterality: Right;   TRIGGER FINGER RELEASE Right 10/26/2017   Procedure: RECONSTRUCTION A-2 PULLEY RIGHT RING FINGER;  Surgeon: Murrell Kuba, MD;  Location: [facility];  Service: Orthopedics;  Laterality: Right;   Patient Active Problem List   Diagnosis Date Noted   Mild intermittent asthma without complication 03/10/2021   Perennial allergic rhinitis 03/10/2021   Conjunctivitis 03/10/2021   Pain of cervical spine 07/19/2017   Pain in finger of right hand 06/28/2017   Rupture of flexor sheath pulley of finger 06/28/2017   Swelling of right ring finger 06/16/2017   Headache, migraine 11/14/2013   Obesity, unspecified 11/14/2013   Hypertriglyceridemia 07/15/2013   Vitamin D  deficiency 07/15/2013   Asthma  06/03/2013   Polycystic ovaries 10/12/2011   Secondary amenorrhea 09/07/2011   Obesity 09/07/2011   Hirsutism 09/07/2011   Acanthosis nigricans 09/07/2011    PCP: Vernon Velna JONELLE, MD  REFERRING PROVIDER: Vernon Velna JONELLE, MD  REFERRING DIAG: M21.371 (ICD-10-CM) - Foot drop, right foot   THERAPY DIAG:  Muscle weakness (generalized)  Other abnormalities of gait and mobility  Rationale for Evaluation and Treatment: Rehabilitation  ONSET DATE: Chronic   SUBJECTIVE:   SUBJECTIVE STATEMENT: Tanya Hayes is a 38 yr old F referred to PT for R foot drop that began a month ago. She noticed during her regular jog that her R foot sounded very heavy and difficult to lift. This progressed to where she could not lift her R foot to hit break while driving. She feels no pain but the top of her foot is numb and tingling and pt reports abnormal sensation over her anterior shin near. She states been getting better a little and has gotten more movement back, but still cannot take more than 10 stairs at once. She is scheduled to meet with neurologist on September 9. EMG scheduled for Obtober 9. Tanya Hayes is an active individual who lost over 100 lbs over the past year.    PERTINENT HISTORY: Psoriatic Arthritis  PAIN:  Are you having pain? No  PRECAUTIONS: None  RED FLAGS: None   WEIGHT BEARING RESTRICTIONS: No  FALLS:  Has patient fallen in last 6 months? 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  DIAGNOSTIC FINDINGS: None  PATIENT SURVEYS:  LEFS: 65/80  COGNITION: Overall cognitive status: Within functional limits for tasks assessed     SENSATION: Tingling and diminished over R 1st and 2nd toe webbing   EDEMA:     MUSCLE LENGTH: NT  POSTURE: No Significant postural limitations  PALPATION: NT  LOWER EXTREMITY ROM:  Active ROM Right eval Left eval  Hip flexion    Hip extension    Hip abduction    Hip adduction    Hip internal rotation    Hip external rotation    Knee flexion    Knee extension    Ankle dorsiflexion 5 0  Ankle plantarflexion WNL WNL  Ankle inversion WNL WNL  Ankle eversion WNL WNL   (Blank rows = not tested)  LOWER EXTREMITY MMT:  MMT Right eval Left eval  Hip flexion 5 5  Hip extension    Hip abduction    Hip adduction    Hip internal rotation    Hip external rotation    Knee flexion 5 5  Knee extension 5 5  Ankle dorsiflexion 3 5  Ankle plantarflexion    Ankle inversion    Ankle eversion 5 5   (Blank rows = not tested)  LOWER EXTREMITY SPECIAL TESTS:  DNT   FUNCTIONAL TESTS:  SLS L: 30s     R: 30s : 471 ft   GAIT: Distance walked: 514ft + Assistive device utilized: None Level of assistance: Complete Independence Comments: R foot drop, slight steppage for clearance                                                                                                                                 TREATMENT DATE: TREATMENT: [facility] Adult PT Treatment:                                                DATE: 12/28/2023 Therapeutic Exercise: Ankle circles CW CCW x 20  Ankle pumps x 20 YTB DF/EV x 15  SL stance x 30 Heel strike to foot flat x 10     PATIENT EDUCATION:  Education details: eval findings, POC, HEP performance Person educated: Patient Education method: Explanation, Demonstration, and Handouts Education comprehension: verbalized understanding and returned demonstration  HOME EXERCISE PROGRAM: Access Code: [email or web address] URL: [email or web address] Date: 12/28/2023 Prepared by: [facility] Program Notes practice slow and controlled heel strike to foot flat stepping on your right side - can hold onto wall or counter for  balance Exercises - Supine Ankle Pumps  -  1 x daily - 7 x weekly - 2-3 sets - 20 reps - Supine Ankle Circles  - 1 x daily - 7 x weekly - 2-3 sets - 20 reps - Ankle Dorsiflexion with Resistance  - 1 x daily - 7 x weekly - 3 sets - 10 reps - yellow band hold - Ankle Eversion with Resistance  - 1 x daily - 7 x weekly - 3 sets - 10 reps - yellow band hold - Single Leg Stance  - 1 x daily - 7 x weekly - 2-3 reps - 30 sec hold   ASSESSMENT:  CLINICAL IMPRESSION: Tanya. Hayes is a 38 y.o. F who was seen today for physical therapy evaluation and treatment for R foot drop. Pt ambulated back to room with audible foot slap and some unsteadiness. Exam findings reveal weakness in dorsiflexion and diminished sensation in her 1st and 2nd toe webbing. These limitations restrict their ability to perform necessary or meaningful tasks including walking, taking stairs, jogging, running, and weightlifting which the pt had been consistently performing prior to dysfunction. Patient-reported outcomes show a moderate level of functional limitations that impact the pts ability to perform tasks at [facility]. Her is well below age related norms. Skilled physical therapy is required to address the patient's deficits which impact their ability to safely and independently perform functional activities.    OBJECTIVE IMPAIRMENTS: Abnormal gait, decreased balance, difficulty walking, and decreased strength.   ACTIVITY LIMITATIONS: carrying, bending, dressing, and locomotion level  PARTICIPATION LIMITATIONS: driving, shopping, community activity, occupation, and yard work  PERSONAL FACTORS: 1 comorbidity: Psoriatic Arthritis are also affecting patient's functional outcome.   REHAB POTENTIAL: Good  CLINICAL DECISION MAKING: Stable/uncomplicated  EVALUATION COMPLEXITY: Low   GOALS: Goals reviewed with patient? No  SHORT TERM GOALS: Target date: 01/18/2024 Patient will be independent with their HEP to promote self-management of  their condition and support progression toward functional goals. Baseline: HEP given at eval  Goal status: INITIAL  2.  Pt will improve active DF on RLE to no less than 10 deg for improved functional peformance and facilitation of normal gait Baseline: 5 Goal status: INITIAL     LONG TERM GOALS: Target date: 02/22/2024   Pt will improve LEFS score to 75/80 to demonstrate a decrease in functional disability Baseline: 65 Goal status: INITIAL  2. Patient will improve performance on from baseline of 444ft to 556ft to reflect improved functional mobility and safety Baseline: 483ft Goal status: INITIAL  3.   Pt will begin training with weights again without any reports of feeling unsteady on her R LE Baseline: unable Goal status: INITIAL  4.   Pt will improve DF strength to 4/5 MMT to demonstrate improvement strength  for necessary tasks like walking Baseline: 3 Goal status: INITIAL  5.  Pt will be able take 10 stairs using a step to pattern without fatigue or being limited by her foot Baseline: unable Goal status: INITIAL  6. Pt will improve active DF on RLE to no less than 15 deg for improved functional peformance and facilitation of normal gait  Baseline: 5 deg  Goal Status: INITIAL    PLAN:  PT FREQUENCY: 1x/week  PT DURATION: 8 weeks  PLANNED INTERVENTIONS: 97110-Therapeutic exercises, 97530- Therapeutic activity, [id number]- Neuromuscular re-education, 97535- Self Care, and [id number]- Manual therapy  PLAN FOR NEXT SESSION: Plan to assess response to HEP, continue with exercises and gait improvements    Holly Schneider, SPT 12/28/23 9:26 AM

## 2024-01-05 ENCOUNTER — Ambulatory Visit: Admitting: Physical Therapy

## 2024-01-11 ENCOUNTER — Ambulatory Visit: Admitting: Physical Therapy

## 2024-01-16 ENCOUNTER — Encounter: Payer: Self-pay | Admitting: Neurology

## 2024-01-16 ENCOUNTER — Other Ambulatory Visit

## 2024-01-16 ENCOUNTER — Ambulatory Visit (INDEPENDENT_AMBULATORY_CARE_PROVIDER_SITE_OTHER): Admitting: Neurology

## 2024-01-16 VITALS — BP 120/83 | HR 86 | Ht 65.0 in | Wt 150.0 lb

## 2024-01-16 DIAGNOSIS — G5731 Lesion of lateral popliteal nerve, right lower limb: Secondary | ICD-10-CM | POA: Diagnosis not present

## 2024-01-16 NOTE — Patient Instructions (Signed)
 Check labs

## 2024-01-16 NOTE — Progress Notes (Signed)
 [facility] Neurology [facility] Clinic Note - Initial Visit   Date: 01/16/2024   Tanya Hayes MRN: [id number] DOB: August 08, 1985   Dear Dr. Vonna:  Thank you for your kind referral of Tanya Hayes for consultation of right foot drop. Although her history is well known to you, please allow us  to reiterate it for the purpose of our medical record. The patient was accompanied to the clinic by self.    Tanya Hayes is a 39 y.o. left-handed female with ADHD and depression presenting for evaluation of right foot drop.   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  Her exam shows mild weakness of right dorsiflexion, eversion, and toe extension as well as hyperesthesia over the dorsum of the foot.  Symptoms have significantly improved since onset.  I suspect that with her remarkable weight loss, there may have been loss of protective fat padding at the fibular head leaving her nerve susceptible to compression.  To be complete, I will check vitamin B12, folate, TSH, SPEP with IFE.  Nerve testing was discussed, however, given her improvement we mutually decided to hold on testing unless symptoms get worse.  She is doing well with PT and has excellent home exercise regimen.  I praised her for her impressive weight loss and lifestyle improvement.    Return to clinic as needed  ------------------------------------------------------------- History of present illness: Starting in July 2025, she began noticing that her right leg felt heavy when she was jogging and when she was driving, she had difficulty extending her foot.  She also has tingling over the dorsum of the foot.  She denies low back pain or radicular leg pain.  No similar symptoms on the left leg.  She has a habit of leg crossing with the left leg.    She lost 80lb over 1.5 years with diet and exercise. Her husband who was also over 500lb  worked together on their weight loss journey and he has also lost over 200lb.   Out-side paper records, electronic medical record, and images have been reviewed where available and summarized as:  Lab Results  Component Value Date   TSH 1.535 06/03/2013   Past Medical History:  Diagnosis Date   Anxiety    Arthritis    Asthma    Obesity    PCOS (polycystic ovarian syndrome)    Psoriasis     Past Surgical History:  Procedure Laterality Date   BREAST REDUCTION SURGERY     CLOSED REDUCTION FINGER WITH PERCUTANEOUS PINNING Right 10/26/2017   Procedure: PINNING PIP JOINT;  Surgeon: Murrell Kuba, MD;  Location: [facility];  Service: Orthopedics;  Laterality: Right;   REPAIR EXTENSOR TENDON Right 10/26/2017   Procedure: EXTENSOR RETINACULUM GRAFT;  Surgeon: Murrell Kuba, MD;  Location: [facility];  Service: Orthopedics;  Laterality: Right;   TRIGGER FINGER RELEASE Right 10/26/2017   Procedure: RECONSTRUCTION A-2 PULLEY RIGHT RING FINGER;  Surgeon: Murrell Kuba, MD;  Location: [facility];  Service: Orthopedics;  Laterality: Right;     Medications:  Outpatient Encounter Medications as of 01/16/2024  Medication Sig   albuterol  (VENTOLIN  HFA) 108 (90 Base) MCG/ACT inhaler Inhale 2 puffs into the lungs every 6 (six) hours as needed for wheezing or shortness of breath.   amphetamine -dextroamphetamine  (ADDERALL XR) 20 MG 24 hr capsule Take 1 capsule (20 mg total) by mouth every morning.   amphetamine -dextroamphetamine  (ADDERALL) 20 MG tablet Take  1 tablet (20 mg total) by mouth daily in the afternoon.   cetirizine (ZYRTEC) 10 MG tablet Take 10 mg by mouth daily. For seasonal allergies dont take in winter   escitalopram (LEXAPRO) 10 MG tablet Take by mouth.   fluticasone  furoate-vilanterol (BREO ELLIPTA ) 100-25 MCG/ACT AEPB Inhale 1 puff into the lungs daily.   risankizumab-rzaa (SKYRIZI PEN) 150 MG/ML pen    albuterol  (VENTOLIN  HFA) 108 (90 Base) MCG/ACT  inhaler Inhale 2 puffs into the lungs every 6 (six) hours as needed for wheezing or shortness of breath. (Patient not taking: Reported on 01/16/2024)   albuterol  (VENTOLIN  HFA) 108 (90 Base) MCG/ACT inhaler Inhale 2 puffs into the lungs every 6 (six) hours as needed for wheezing or shortness of breath. (Patient not taking: Reported on 01/16/2024)   Etonogestrel  (NEXPLANON  [place]) Inject into the skin.   fexofenadine  (ALLEGRA  ALLERGY) 60 MG tablet Take 1 tablet (60 mg total) by mouth 2 (two) times daily. (Patient taking differently: Take 60 mg by mouth daily. For her cat allergy)   No facility-administered encounter medications on file as of 01/16/2024.    Allergies: No Known Allergies  Family History: Family History  Problem Relation Age of Onset   Healthy Mother    Diabetes Father    Hypertension Maternal Grandmother    Thyroid disease Maternal Grandmother    Stroke Maternal Grandmother    Stroke Maternal Grandfather     Social History: Social History   Tobacco Use   Smoking status: Never   Smokeless tobacco: Never  Vaping Use   Vaping status: Never Used  Substance Use Topics   Alcohol use: Not Currently   Drug use: No   Social History   Social History Narrative   Are you right handed or left handed? Left Handed    Are you currently employed ? 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 No   Who lives with you? Son    What type of home do you live in: 1 story or 2 story? One story home        Vital Signs:  BP 120/83   Pulse 86   Ht 5' 5 (1.651 m)   Wt 150 lb (68 kg)   SpO2 99%   BMI 24.96 kg/m    Neurological Exam: MENTAL STATUS including orientation to time, place, person, recent and remote memory, attention span and concentration, language, and fund of knowledge is normal.  Speech is not dysarthric.  CRANIAL NERVES: II:  No visual field defects.     III-IV-VI: Pupils equal round and reactive to light.   Normal conjugate, extra-ocular eye movements in all directions of gaze.  No nystagmus.  No ptosis.   V:  Normal facial sensation.    VII:  Normal facial symmetry and movements.   VIII:  Normal hearing and vestibular function.   IX-X:  Normal palatal movement.   XI:  Normal shoulder shrug and head rotation.   XII:  Normal tongue strength and range of motion, no deviation or fasciculation.  MOTOR:  No atrophy, fasciculations or abnormal movements.  No pronator drift.   Upper Extremity:  Right  Left  Deltoid  5/5   5/5   Biceps  5/5   5/5   Triceps  5/5   5/5   Wrist extensors  5/5   5/5   Wrist flexors  5/5   5/5   Finger extensors  5/5  5/5   Finger flexors  5/5   5/5   Dorsal interossei  5/5   5/5   Abductor pollicis  5/5   5/5   Tone (Ashworth scale)  0  0   Lower Extremity:  Right  Left  Hip flexors  5/5   5/5   Knee flexors  5/5   5/5   Knee extensors  5/5   5/5   Dorsiflexors  5-/5   5/5   Plantarflexors  5/5   5/5   Inversion 5/5  5/5  Eversion 5-/5  5/5  Toe extensors  5-/5   5/5   Toe flexors  5/5   5/5   Tone (Ashworth scale)  0  0   MSRs:                                           Right        Left brachioradialis 2+  2+  biceps 2+  2+  triceps 2+  2+  patellar 2+  2+  ankle jerk 2+  2+  Hoffman no  no  plantar response down  down   SENSORY:  Hyperesthesia to pin prick and temperature over the dorsum of the right foot, otherwise normal sensation throughout.   COORDINATION/GAIT: Normal finger-to- nose-finger.  Intact rapid alternating movements bilaterally.  Able to rise from a chair without using arms.  Gait narrow based and stable. Tandem and stressed gait intact.    Thank you for allowing me to participate in patient's care.  If I can answer any additional questions, I would be pleased to do so.    Sincerely,    Chelsea Nusz K. Tobie, DO

## 2024-01-17 ENCOUNTER — Ambulatory Visit

## 2024-01-22 DIAGNOSIS — Z0000 Encounter for general adult medical examination without abnormal findings: Secondary | ICD-10-CM | POA: Diagnosis not present

## 2024-01-22 DIAGNOSIS — E7800 Pure hypercholesterolemia, unspecified: Secondary | ICD-10-CM | POA: Diagnosis not present

## 2024-01-22 DIAGNOSIS — Z833 Family history of diabetes mellitus: Secondary | ICD-10-CM | POA: Diagnosis not present

## 2024-01-22 DIAGNOSIS — E282 Polycystic ovarian syndrome: Secondary | ICD-10-CM | POA: Diagnosis not present

## 2024-01-22 DIAGNOSIS — Z23 Encounter for immunization: Secondary | ICD-10-CM | POA: Diagnosis not present

## 2024-01-23 ENCOUNTER — Ambulatory Visit: Payer: Self-pay | Admitting: Neurology

## 2024-01-23 LAB — IMMUNOFIXATION ELECTROPHORESIS
IgG (Immunoglobin G), Serum: 1041 mg/dL (ref 600–1640)
IgM, Serum: 49 mg/dL — ABNORMAL LOW (ref 50–300)
Immunoglobulin A: 170 mg/dL (ref 47–310)

## 2024-01-23 LAB — TSH: TSH: 0.72 m[IU]/L

## 2024-01-23 LAB — PROTEIN ELECTROPHORESIS, SERUM
Albumin ELP: 4 g/dL (ref 3.8–4.8)
Alpha 1: 0.3 g/dL (ref 0.2–0.3)
Alpha 2: 0.6 g/dL (ref 0.5–0.9)
Beta 2: 0.3 g/dL (ref 0.2–0.5)
Beta Globulin: 0.4 g/dL (ref 0.4–0.6)
Gamma Globulin: 0.9 g/dL (ref 0.8–1.7)
Total Protein: 6.6 g/dL (ref 6.1–8.1)

## 2024-01-23 LAB — B12 AND FOLATE PANEL
Folate: 11.1 ng/mL
Vitamin B-12: 466 pg/mL (ref 200–1100)

## 2024-01-24 ENCOUNTER — Encounter

## 2024-01-31 ENCOUNTER — Encounter: Admitting: Physical Therapy

## 2024-02-08 ENCOUNTER — Other Ambulatory Visit: Payer: Self-pay | Admitting: Medical Genetics

## 2024-02-15 ENCOUNTER — Encounter: Admitting: Neurology

## 2024-02-20 ENCOUNTER — Encounter: Admitting: Physical Medicine & Rehabilitation

## 2024-02-21 ENCOUNTER — Other Ambulatory Visit (HOSPITAL_COMMUNITY): Payer: Self-pay

## 2024-02-21 ENCOUNTER — Other Ambulatory Visit: Payer: Self-pay

## 2024-02-21 DIAGNOSIS — F1621 Hallucinogen dependence, in remission: Secondary | ICD-10-CM | POA: Diagnosis not present

## 2024-02-21 DIAGNOSIS — F1021 Alcohol dependence, in remission: Secondary | ICD-10-CM | POA: Diagnosis not present

## 2024-02-21 DIAGNOSIS — F3181 Bipolar II disorder: Secondary | ICD-10-CM | POA: Diagnosis not present

## 2024-02-21 DIAGNOSIS — F411 Generalized anxiety disorder: Secondary | ICD-10-CM | POA: Diagnosis not present

## 2024-02-21 MED ORDER — LAMOTRIGINE 25 MG PO TABS
25.0000 mg | ORAL_TABLET | Freq: Every day | ORAL | 0 refills | Status: DC
  Filled 2024-02-21: qty 90, 90d supply, fill #0

## 2024-02-23 ENCOUNTER — Other Ambulatory Visit (HOSPITAL_COMMUNITY): Payer: Self-pay

## 2024-03-04 ENCOUNTER — Other Ambulatory Visit

## 2024-03-07 DIAGNOSIS — F3181 Bipolar II disorder: Secondary | ICD-10-CM | POA: Diagnosis not present

## 2024-03-07 DIAGNOSIS — F1021 Alcohol dependence, in remission: Secondary | ICD-10-CM | POA: Diagnosis not present

## 2024-03-07 DIAGNOSIS — F1621 Hallucinogen dependence, in remission: Secondary | ICD-10-CM | POA: Diagnosis not present

## 2024-03-07 DIAGNOSIS — F411 Generalized anxiety disorder: Secondary | ICD-10-CM | POA: Diagnosis not present

## 2024-03-08 ENCOUNTER — Other Ambulatory Visit (HOSPITAL_COMMUNITY)
Admission: RE | Admit: 2024-03-08 | Discharge: 2024-03-08 | Disposition: A | Discharge status: Home / Self Care | Payer: Self-pay | Source: Ambulatory Visit | Attending: Oncology | Admitting: Oncology

## 2024-03-18 LAB — GENECONNECT MOLECULAR SCREEN: Genetic Analysis Overall Interpretation: NEGATIVE

## 2024-03-27 ENCOUNTER — Other Ambulatory Visit (HOSPITAL_COMMUNITY): Payer: Self-pay

## 2024-03-27 DIAGNOSIS — F3181 Bipolar II disorder: Secondary | ICD-10-CM | POA: Diagnosis not present

## 2024-03-27 DIAGNOSIS — F1021 Alcohol dependence, in remission: Secondary | ICD-10-CM | POA: Diagnosis not present

## 2024-03-27 DIAGNOSIS — F1621 Hallucinogen dependence, in remission: Secondary | ICD-10-CM | POA: Diagnosis not present

## 2024-03-27 DIAGNOSIS — F411 Generalized anxiety disorder: Secondary | ICD-10-CM | POA: Diagnosis not present

## 2024-03-27 MED ORDER — LAMOTRIGINE 25 MG PO TABS
ORAL_TABLET | ORAL | 1 refills | Status: AC
  Filled 2024-03-27: qty 60, 30d supply, fill #0
  Filled 2024-05-10 (×2): qty 90, 30d supply, fill #0

## 2024-04-03 ENCOUNTER — Other Ambulatory Visit (HOSPITAL_COMMUNITY): Payer: Self-pay

## 2024-05-10 ENCOUNTER — Other Ambulatory Visit: Payer: Self-pay

## 2024-05-10 ENCOUNTER — Encounter (HOSPITAL_COMMUNITY): Payer: Self-pay

## 2024-05-10 ENCOUNTER — Other Ambulatory Visit (HOSPITAL_COMMUNITY): Payer: Self-pay

## 2024-05-11 ENCOUNTER — Other Ambulatory Visit (HOSPITAL_COMMUNITY): Payer: Self-pay

## 2024-05-11 ENCOUNTER — Telehealth: Admitting: Nurse Practitioner

## 2024-05-11 DIAGNOSIS — J069 Acute upper respiratory infection, unspecified: Secondary | ICD-10-CM

## 2024-05-11 MED ORDER — PREDNISONE 20 MG PO TABS
20.0000 mg | ORAL_TABLET | Freq: Every day | ORAL | 0 refills | Status: AC
  Filled 2024-05-11: qty 7, 7d supply, fill #0

## 2024-05-11 MED ORDER — AZITHROMYCIN 250 MG PO TABS
ORAL_TABLET | ORAL | 0 refills | Status: AC
  Filled 2024-05-11: qty 6, 5d supply, fill #0

## 2024-05-11 MED ORDER — PSEUDOEPH-BROMPHEN-DM 30-2-10 MG/5ML PO SYRP
5.0000 mL | ORAL_SOLUTION | Freq: Four times a day (QID) | ORAL | 0 refills | Status: AC | PRN
  Filled 2024-05-11: qty 240, 12d supply, fill #0

## 2024-05-11 NOTE — Progress Notes (Signed)
 E visit for Flu like symptoms   We are sorry that you are not feeling well.  Here is how we plan to help! Based on what you have shared with me it looks like you may have possible exposure to a virus that causes influenza.  Influenza or the flu is  an infection caused by a respiratory virus. The flu virus is highly contagious and persons who did not receive their yearly flu vaccination may catch the flu from close contact.  We have anti-viral medications to treat the viruses that cause this infection. They are not a cure and only shorten the course of the infection. These prescriptions are most effective when they are given within the first 2 days of flu symptoms. Antiviral medications are indicated if you have a high risk of complications from the flu. You should  also consider an antiviral medication if you are in close contact with someone who is at risk. These medications can help patients avoid complications from the flu but have side effects that you should know.   Possible side effects from Tamiflu  or oseltamivir  include nausea, vomiting, diarrhea, dizziness, headaches, eye redness, sleep problems or other respiratory symptoms. You should not take Tamiflu  if you have an allergy to oseltamivir  or any to the ingredients in Tamiflu .  Based upon your symptoms and potential risk factors I have prescribed a zpak and prednisone .   For nasal congestion, you may use an oral decongestant such as Mucinex D or if you have glaucoma or high blood pressure use plain Mucinex.  Saline nasal spray or nasal drops can help and can safely be used as often as needed for congestion.  If you have a sore or scratchy throat, use a saltwater gargle-  to  teaspoon of salt dissolved in a 4-ounce to 8-ounce glass of warm water.  Gargle the solution for approximately 15-30 seconds and then spit.  It is important not to swallow the solution.  You can also use throat lozenges/cough drops and Chloraseptic spray to  help with throat pain or discomfort.  Warm or cold liquids can also be helpful in relieving throat pain.  For headache, pain or general discomfort, you can use Ibuprofen  or Tylenol  as directed.   Some authorities believe that zinc sprays or the use of Echinacea may shorten the course of your symptoms.  I have prescribed the following medications to help lessen symptoms: I have prescribed a cough syrup  You are to isolate at home until you have been fever-free for at least 24 hours without a fever-reducing medication, and symptoms have been steadily improving for 24 hours.  If you must be around other household members who do not have symptoms, you need to make sure that both you and the family members are masking consistently with a high-quality mask.  If you note any worsening of symptoms despite treatment, please seek an in-person evaluation ASAP. If you note any significant shortness of breath or any chest pain, please seek ED evaluation. Please do not delay care!  ANYONE WHO HAS FLU SYMPTOMS SHOULD: Stay home. The flu is highly contagious and going out or to work exposes others! Be sure to drink plenty of fluids. Water is fine as well as fruit juices, sodas and electrolyte beverages. You may want to stay away from caffeine  or alcohol. If you are nauseated, try taking small sips of liquids. How do you know if you are getting enough fluid? Your urine should be a pale yellow or almost colorless. Get  rest. Taking a steamy shower or using a humidifier may help nasal congestion and ease sore throat pain. Using a saline nasal spray works much the same way. Cough drops, hard candies and sore throat lozenges may ease your cough. Line up a caregiver. Have someone check on you regularly.  GET HELP RIGHT AWAY IF: You cannot keep down liquids or your medications. You become short of breath Your fell like you are going to pass out or loose consciousness. Your symptoms persist after you have completed  your treatment plan  MAKE SURE YOU  Understand these instructions. Will watch your condition. Will get help right away if you are not doing well or get worse.  Your e-visit answers were reviewed by a board certified advanced clinical practitioner to complete your personal care plan.  Depending on the condition, your plan could have included both over the counter or prescription medications.  If there is a problem please reply  once you have received a response from your provider.  Your safety is important to us .  If you have drug allergies check your prescription carefully.    You can use MyChart to ask questions about todays visit, request a non-urgent call back, or ask for a work or school excuse for 24 hours related to this e-Visit. If it has been greater than 24 hours you will need to follow up with your provider, or enter a new e-Visit to address those concerns.  You will get an e-mail in the next two days asking about your experience.  I hope that your e-visit has been valuable and will speed your recovery. Thank you for using e-visits.   I have spent 5 minutes in review of e-visit questionnaire, review and updating patient chart, medical decision making and response to patient.   Veida Spira W Sharman Garrott, NP

## 2024-05-17 ENCOUNTER — Other Ambulatory Visit (HOSPITAL_COMMUNITY): Payer: Self-pay

## 2024-05-17 MED ORDER — LAMOTRIGINE 25 MG PO TABS
25.0000 mg | ORAL_TABLET | ORAL | 1 refills | Status: AC
  Filled 2024-05-17: qty 90, 30d supply, fill #0

## 2024-05-21 ENCOUNTER — Other Ambulatory Visit (HOSPITAL_COMMUNITY): Payer: Self-pay

## 2024-05-23 ENCOUNTER — Other Ambulatory Visit (HOSPITAL_COMMUNITY): Payer: Self-pay

## 2024-06-05 ENCOUNTER — Other Ambulatory Visit (HOSPITAL_COMMUNITY): Payer: Self-pay

## 2024-06-05 MED ORDER — LAMOTRIGINE 25 MG PO TABS
50.0000 mg | ORAL_TABLET | Freq: Two times a day (BID) | ORAL | 1 refills | Status: AC
  Filled 2024-06-05: qty 120, 30d supply, fill #0
